# Patient Record
Sex: Female | Born: 1990 | Race: Black or African American | Hispanic: No | Marital: Single | Smoking: Never smoker
Health system: Southern US, Community
[De-identification: ages and names within clinical notes are randomized; demographics above are authoritative.]

## PROBLEM LIST (undated history)

## (undated) ENCOUNTER — Ambulatory Visit

## (undated) DIAGNOSIS — R0600 Dyspnea, unspecified: Secondary | ICD-10-CM

## (undated) DIAGNOSIS — I10 Essential (primary) hypertension: Secondary | ICD-10-CM

## (undated) DIAGNOSIS — R51 Headache: Secondary | ICD-10-CM

## (undated) DIAGNOSIS — R519 Headache, unspecified: Secondary | ICD-10-CM

## (undated) DIAGNOSIS — M940 Chondrocostal junction syndrome [Tietze]: Secondary | ICD-10-CM

## (undated) DIAGNOSIS — E282 Polycystic ovarian syndrome: Secondary | ICD-10-CM

## (undated) HISTORY — DX: Polycystic ovarian syndrome: E28.2

## (undated) HISTORY — PX: UTERINE SEPTUM RESECTION: SHX5386

## (undated) HISTORY — PX: WISDOM TOOTH EXTRACTION: SHX21

## (undated) HISTORY — PX: SHOULDER SURGERY: SHX246

---
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
## 2012-04-01 DIAGNOSIS — Z0000 Encounter for general adult medical examination without abnormal findings: Secondary | ICD-10-CM | POA: Insufficient documentation

## 2013-02-28 ENCOUNTER — Ambulatory Visit: Payer: Self-pay

## 2013-05-02 ENCOUNTER — Emergency Department (HOSPITAL_COMMUNITY): Payer: 59

## 2013-05-02 ENCOUNTER — Encounter (HOSPITAL_COMMUNITY): Payer: Self-pay | Admitting: Emergency Medicine

## 2013-05-02 ENCOUNTER — Emergency Department (HOSPITAL_COMMUNITY)
Admission: EM | Admit: 2013-05-02 | Discharge: 2013-05-03 | Disposition: A | Discharge status: Home / Self Care | Payer: 59 | Attending: Emergency Medicine | Admitting: Emergency Medicine

## 2013-05-02 DIAGNOSIS — Y92009 Unspecified place in unspecified non-institutional (private) residence as the place of occurrence of the external cause: Secondary | ICD-10-CM | POA: Insufficient documentation

## 2013-05-02 DIAGNOSIS — W010XXA Fall on same level from slipping, tripping and stumbling without subsequent striking against object, initial encounter: Secondary | ICD-10-CM | POA: Insufficient documentation

## 2013-05-02 DIAGNOSIS — Y9389 Activity, other specified: Secondary | ICD-10-CM | POA: Insufficient documentation

## 2013-05-02 DIAGNOSIS — S42023A Displaced fracture of shaft of unspecified clavicle, initial encounter for closed fracture: Secondary | ICD-10-CM | POA: Insufficient documentation

## 2013-05-02 DIAGNOSIS — Z791 Long term (current) use of non-steroidal anti-inflammatories (NSAID): Secondary | ICD-10-CM | POA: Insufficient documentation

## 2013-05-02 DIAGNOSIS — Z79899 Other long term (current) drug therapy: Secondary | ICD-10-CM | POA: Insufficient documentation

## 2013-05-02 DIAGNOSIS — S42002A Fracture of unspecified part of left clavicle, initial encounter for closed fracture: Secondary | ICD-10-CM

## 2013-05-02 MED ORDER — OXYCODONE-ACETAMINOPHEN 5-325 MG PO TABS
2.0000 | ORAL_TABLET | Freq: Once | ORAL | Status: AC
  Administered 2013-05-02: 2 via ORAL
  Filled 2013-05-02: qty 2

## 2013-05-02 NOTE — ED Notes (Signed)
Bed: [facility] Expected date:  Expected time:  Means of arrival:  Comments: EMS/deformity to clavicle

## 2013-05-02 NOTE — ED Notes (Signed)
Brought in by EMS from home after pt's fall with subsequent pain and deformity to left clavicle.  Per EMS, pt tripped over and her boyfriend tried to catch her from falling but both fell and pt landed on her left side on a carpeted floor--- pt has had immediate pain to left clavicle area.  Pt arrived to ED on triangle bandage.

## 2013-05-03 MED ORDER — OXYCODONE-ACETAMINOPHEN 5-325 MG PO TABS: 2.0000 | ORAL_TABLET | ORAL | Status: DC | PRN

## 2013-05-03 NOTE — ED Provider Notes (Signed)
CSN: [id number]     Arrival date & time 05/02/13  2224 History   First MD Initiated Contact with Patient 05/02/13 2319     Chief Complaint  Patient presents with  . Clavicle Injury   (Consider location/radiation/quality/duration/timing/severity/associated sxs/prior Treatment) Patient is a 22 y.o. female presenting with fall. The history is provided by the patient. No language interpreter was used.  Fall This is a new problem. The current episode started today. Pertinent negatives include no chest pain.   Pt is a 22 year old who was being carried in her boyfriends arms tonight when he tripped and fell with her. She presents with left clavicle pain. She denies any numbness, tingling or paresthesias. She reports limited ROM due to pain. She has not taken anything for the pain or used an ice pack.   History reviewed. No pertinent past medical history. History reviewed. No pertinent past surgical history. History reviewed. No pertinent family history. History  Substance Use Topics  . Smoking status: Never Smoker   . Smokeless tobacco: Never Used  . Alcohol Use: Yes   OB History   Grav Para Term Preterm Abortions TAB SAB Ect Mult Living                 Review of Systems  Respiratory: Negative for chest tightness and shortness of breath.   Cardiovascular: Negative for chest pain.  All other systems reviewed and are negative.    Allergies  Peanuts and Shrimp  Home Medications   Current Outpatient Rx  Name  Route  Sig  Dispense  Refill  . ibuprofen (ADVIL,MOTRIN) 200 MG tablet   Oral   Take 200 mg by mouth every 6 (six) hours as needed.         . medroxyPROGESTERone (DEPO-PROVERA) 150 MG/ML injection   Intramuscular   Inject 150 mg into the muscle every 3 (three) months. September 2014         . naproxen sodium (ANAPROX) 220 MG tablet   Oral   Take 220 mg by mouth 2 (two) times daily with a meal.         . oxyCODONE-acetaminophen (PERCOCET/ROXICET) 5-325 MG per  tablet   Oral   Take 2 tablets by mouth every 4 (four) hours as needed for severe pain.   15 tablet   0    BP 116/112  Pulse 110  Temp(Src) 99.4 F (37.4 C) (Oral)  Resp 20  SpO2 99% Physical Exam  Nursing note and vitals reviewed. Constitutional: She is oriented to person, place, and time. She appears well-developed and well-nourished. No distress.  HENT:  Head: Normocephalic and atraumatic.  Mouth/Throat: Oropharynx is clear and moist.  Eyes: Conjunctivae and EOM are normal. Pupils are equal, round, and reactive to light.  Neck: Normal range of motion. Neck supple. No JVD present. No tracheal deviation present. No thyromegaly present.  Cardiovascular: Normal rate, regular rhythm and normal heart sounds.   Pulmonary/Chest: Effort normal and breath sounds normal.  Abdominal: Soft. Bowel sounds are normal. She exhibits no distension. There is no tenderness.  Musculoskeletal:       Left shoulder: She exhibits normal range of motion.       Arms: Edema and noted deformity left clavicle.  Neurological: She is alert and oriented to person, place, and time.  Skin: Skin is warm and dry.  Psychiatric: She has a normal mood and affect. Her behavior is normal. Judgment and thought content normal.    ED Course  Procedures (including critical  care time) Labs Review Labs Reviewed - No data to display Imaging Review Dg Chest 2 View  05/03/2013   CLINICAL DATA:  Shortness of breath; known left clavicular fracture.  EXAM: CHEST  2 VIEW  COMPARISON:  None.  FINDINGS: The lungs are well-aerated and clear. There is no evidence of focal opacification, pleural effusion or pneumothorax.  The heart is normal in size; the mediastinal contour is within normal limits. A mildly displaced and shortened left mid-clavicular fracture is again noted.  IMPRESSION: 1. No acute cardiopulmonary process seen. 2. Mildly displaced and shortened left mid-clavicular fracture again noted.   Electronically Signed   By:  Roanna Raider M.D.   On: 05/03/2013 00:17   Dg Shoulder Left  05/02/2013   CLINICAL DATA:  Left clavicle pain following a fall.  EXAM: LEFT SHOULDER - 2+ VIEW  COMPARISON:  None.  FINDINGS: Fracture of the mid to distal left clavicle with 2 cm of overlapping of the fragments as well as inferior displacement and superior angulation of the distal fragment.  IMPRESSION: Left clavicle fracture as described above.   Electronically Signed   By: Gordan Payment M.D.   On: 05/02/2013 23:28    EKG Interpretation   None       MDM   1. Clavicle fracture, left, closed, initial encounter    Pt's boyfriend fell while he was carrying her. +deformity, left clavicle. X-ray; fracture of distal left clavicle with 2cm of overlapping, inferior displacement. Ortho consult via phone, Dr. Yisroel Ramming, place pt in sling and follow-up this week in office with him. Percocet for mod-severe pain.      Irish Elders, NP 05/03/13 [phone]

## 2013-05-03 NOTE — ED Provider Notes (Signed)
Medical screening examination/treatment/procedure(s) were performed by non-physician practitioner and as supervising physician I was immediately available for consultation/collaboration.  EKG Interpretation   None         Shanna Cisco, MD 05/03/13 1054

## 2014-11-21 ENCOUNTER — Other Ambulatory Visit: Payer: Self-pay | Admitting: Family Medicine

## 2014-11-21 ENCOUNTER — Ambulatory Visit
Admission: RE | Admit: 2014-11-21 | Discharge: 2014-11-21 | Disposition: A | Discharge status: Home / Self Care | Payer: 59 | Source: Ambulatory Visit | Attending: Family Medicine | Admitting: Family Medicine

## 2014-11-21 DIAGNOSIS — R079 Chest pain, unspecified: Secondary | ICD-10-CM

## 2016-03-10 ENCOUNTER — Other Ambulatory Visit: Payer: Self-pay

## 2016-03-12 ENCOUNTER — Other Ambulatory Visit: Payer: Self-pay

## 2016-03-13 ENCOUNTER — Other Ambulatory Visit: Payer: Self-pay

## 2016-08-13 ENCOUNTER — Other Ambulatory Visit: Payer: Self-pay | Admitting: Obstetrics and Gynecology

## 2016-08-13 DIAGNOSIS — N63 Unspecified lump in unspecified breast: Secondary | ICD-10-CM

## 2016-08-17 ENCOUNTER — Ambulatory Visit
Admission: RE | Admit: 2016-08-17 | Discharge: 2016-08-17 | Disposition: A | Discharge status: Home / Self Care | Payer: 59 | Source: Ambulatory Visit | Attending: Obstetrics and Gynecology | Admitting: Obstetrics and Gynecology

## 2016-08-17 DIAGNOSIS — N63 Unspecified lump in unspecified breast: Secondary | ICD-10-CM

## 2016-12-17 ENCOUNTER — Encounter: Payer: Self-pay | Admitting: Obstetrics and Gynecology

## 2016-12-17 NOTE — H&P (Addendum)
Kaitlyn Noble is an 26 y.o. female. Presenting for MAB. 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   Pertinent Gynecological History: Menses: flow is moderate Bleeding: dysfunctional uterine bleeding Contraception: none DES exposure: denies   H/o abnl pap smear  Past Medical History:  Diagnosis Date  . Chronic hypertension   Labetalol 200mg  BID  No past surgical history on file.  No family history on file.  Social History:  reports that she has never smoked. She has never used smokeless tobacco. She reports that she drinks alcohol. She reports that she does not use drugs.  Allergies:  Allergies  Allergen Reactions  . Peanuts [Peanut Oil] Hives  . Shrimp [Shellfish Allergy]     unknown    No prescriptions prior to admission.    ROS  There were no vitals taken for this visit. Physical Exam  From office Gen: well appearing, NAD CV: Reg rate Pulm: NWOB Abd: soft, nondistended, nontender, no masses GYN: uterus 7-10 week size, no adnexa ttp/CMT Ext: No edema b/l   No results found for this or any previous visit (from the past 24 hour(s)).  No results found.  Assessment/Plan: 25yo w/MAB @~8wga. 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  cHTN - c/w labetaol 200mg  BID Risks discussed including infection, bleeding, damage to surrounding structures, need for additional procedures, postoperative DVT, blood transfusion, and hysterectomy. All questions answered. Consent signed in clinic. [facility] given possible uterine septum. SIS postpartum per primary GYN attending.  Plan IV doxycyline preop and postop for MAB RH +.   Kaitlyn Noble 12/17/2016, 2:21 PM   No updates to above H&P. Patient arrived NPO and was consented in PACU. Risks discussed including infection, bleeding, damage to surrounding structures, need for additional procedures, and postoperative DVT/transfusion. All questions answered. Consent signed.  Proceed with above surgery.   Belva AgeeElise Babbette Dalesandro MD

## 2016-12-18 ENCOUNTER — Encounter (HOSPITAL_COMMUNITY): Payer: Self-pay

## 2016-12-22 ENCOUNTER — Ambulatory Visit (HOSPITAL_COMMUNITY): Payer: 59 | Admitting: Registered Nurse

## 2016-12-22 ENCOUNTER — Encounter (HOSPITAL_COMMUNITY): Payer: Self-pay | Admitting: Registered Nurse

## 2016-12-22 ENCOUNTER — Ambulatory Visit (HOSPITAL_COMMUNITY)
Admission: RE | Admit: 2016-12-22 | Discharge: 2016-12-22 | Disposition: A | Discharge status: Home / Self Care | Payer: 59 | Source: Ambulatory Visit | Attending: Obstetrics and Gynecology | Admitting: Obstetrics and Gynecology

## 2016-12-22 ENCOUNTER — Ambulatory Visit (HOSPITAL_COMMUNITY): Payer: 59

## 2016-12-22 ENCOUNTER — Encounter (HOSPITAL_COMMUNITY)
Admission: RE | Disposition: A | Discharge status: Home / Self Care | Payer: Self-pay | Source: Ambulatory Visit | Attending: Obstetrics and Gynecology

## 2016-12-22 DIAGNOSIS — Z91013 Allergy to seafood: Secondary | ICD-10-CM | POA: Diagnosis not present

## 2016-12-22 DIAGNOSIS — Z3A10 10 weeks gestation of pregnancy: Secondary | ICD-10-CM | POA: Diagnosis not present

## 2016-12-22 DIAGNOSIS — O021 Missed abortion: Secondary | ICD-10-CM | POA: Diagnosis not present

## 2016-12-22 DIAGNOSIS — N938 Other specified abnormal uterine and vaginal bleeding: Secondary | ICD-10-CM | POA: Insufficient documentation

## 2016-12-22 DIAGNOSIS — Z91010 Allergy to peanuts: Secondary | ICD-10-CM | POA: Diagnosis not present

## 2016-12-22 DIAGNOSIS — O161 Unspecified maternal hypertension, first trimester: Secondary | ICD-10-CM | POA: Diagnosis not present

## 2016-12-22 HISTORY — PX: DILATION AND EVACUATION: SHX1459

## 2016-12-22 HISTORY — DX: Headache: R51

## 2016-12-22 HISTORY — DX: Headache, unspecified: R51.9

## 2016-12-22 HISTORY — DX: Dyspnea, unspecified: R06.00

## 2016-12-22 HISTORY — DX: Essential (primary) hypertension: I10

## 2016-12-22 HISTORY — DX: Chondrocostal junction syndrome (tietze): M94.0

## 2016-12-22 SURGERY — DILATION AND EVACUATION, UTERUS
Anesthesia: General

## 2016-12-22 MED ORDER — ONDANSETRON HCL 4 MG/2ML IJ SOLN
INTRAMUSCULAR | Status: DC | PRN
Start: 2016-12-22 — End: 2016-12-22
  Administered 2016-12-22: 4 mg via INTRAVENOUS

## 2016-12-22 MED ORDER — KETOROLAC TROMETHAMINE 30 MG/ML IJ SOLN
INTRAMUSCULAR | Status: AC
  Filled 2016-12-22: qty 1

## 2016-12-22 MED ORDER — LIDOCAINE 2% (20 MG/ML) 5 ML SYRINGE
INTRAMUSCULAR | Status: DC | PRN
  Administered 2016-12-22: 60 mg via INTRAVENOUS

## 2016-12-22 MED ORDER — ONDANSETRON HCL 4 MG/2ML IJ SOLN
INTRAMUSCULAR | Status: AC
  Filled 2016-12-22: qty 2

## 2016-12-22 MED ORDER — IBUPROFEN 600 MG PO TABS: 600.0000 mg | ORAL_TABLET | Freq: Four times a day (QID) | ORAL | Status: DC | PRN

## 2016-12-22 MED ORDER — ONDANSETRON HCL 4 MG/2ML IJ SOLN
4.0000 mg | Freq: Once | INTRAMUSCULAR | Status: AC | PRN
  Administered 2016-12-22: 4 mg via INTRAVENOUS

## 2016-12-22 MED ORDER — FENTANYL CITRATE (PF) 100 MCG/2ML IJ SOLN
25.0000 ug | INTRAMUSCULAR | Status: DC | PRN
  Administered 2016-12-22 (×2): 50 ug via INTRAVENOUS

## 2016-12-22 MED ORDER — MIDAZOLAM HCL 5 MG/5ML IJ SOLN
INTRAMUSCULAR | Status: DC | PRN
  Administered 2016-12-22: 2 mg via INTRAVENOUS

## 2016-12-22 MED ORDER — DEXAMETHASONE SODIUM PHOSPHATE 10 MG/ML IJ SOLN
INTRAMUSCULAR | Status: AC
  Filled 2016-12-22: qty 1

## 2016-12-22 MED ORDER — SCOPOLAMINE 1 MG/3DAYS TD PT72
1.0000 | MEDICATED_PATCH | Freq: Once | TRANSDERMAL | Status: DC
Start: 2016-12-22 — End: 2016-12-22
  Administered 2016-12-22: 1.5 mg via TRANSDERMAL

## 2016-12-22 MED ORDER — DOXYCYCLINE HYCLATE 100 MG IV SOLR
200.0000 mg | Freq: Once | INTRAVENOUS | Status: AC
  Administered 2016-12-22: 200 mg via INTRAVENOUS
  Filled 2016-12-22: qty 200

## 2016-12-22 MED ORDER — LIDOCAINE HCL (CARDIAC) 20 MG/ML IV SOLN
INTRAVENOUS | Status: AC
  Filled 2016-12-22: qty 5

## 2016-12-22 MED ORDER — FENTANYL CITRATE (PF) 100 MCG/2ML IJ SOLN
INTRAMUSCULAR | Status: DC | PRN
  Administered 2016-12-22: 25 ug via INTRAVENOUS
  Administered 2016-12-22: 50 ug via INTRAVENOUS
  Administered 2016-12-22: 25 ug via INTRAVENOUS

## 2016-12-22 MED ORDER — LACTATED RINGERS IV SOLN
INTRAVENOUS | Status: DC
  Administered 2016-12-22: 13:00:00 via INTRAVENOUS

## 2016-12-22 MED ORDER — DEXAMETHASONE SODIUM PHOSPHATE 10 MG/ML IJ SOLN
INTRAMUSCULAR | Status: DC | PRN
  Administered 2016-12-22: 10 mg via INTRAVENOUS

## 2016-12-22 MED ORDER — KETOROLAC TROMETHAMINE 30 MG/ML IJ SOLN
INTRAMUSCULAR | Status: DC | PRN
  Administered 2016-12-22: 30 mg via INTRAVENOUS

## 2016-12-22 MED ORDER — PROPOFOL 10 MG/ML IV BOLUS
INTRAVENOUS | Status: DC | PRN
  Administered 2016-12-22: 40 mg via INTRAVENOUS
  Administered 2016-12-22: 160 mg via INTRAVENOUS

## 2016-12-22 MED ORDER — SCOPOLAMINE 1 MG/3DAYS TD PT72
MEDICATED_PATCH | TRANSDERMAL | Status: AC
  Administered 2016-12-22: 1.5 mg via TRANSDERMAL
  Filled 2016-12-22: qty 1

## 2016-12-22 MED ORDER — MIDAZOLAM HCL 2 MG/2ML IJ SOLN
INTRAMUSCULAR | Status: AC
Start: 2016-12-22 — End: ?
  Filled 2016-12-22: qty 2

## 2016-12-22 MED ORDER — CHLOROPROCAINE HCL 1 % IJ SOLN
INTRAMUSCULAR | Status: DC | PRN
  Administered 2016-12-22: 10 mL

## 2016-12-22 MED ORDER — OXYCODONE-ACETAMINOPHEN 5-325 MG PO TABS: 1.0000 | ORAL_TABLET | ORAL | Status: DC | PRN

## 2016-12-22 MED ORDER — ONDANSETRON HCL 4 MG PO TABS: 4.0000 mg | ORAL_TABLET | Freq: Four times a day (QID) | ORAL | Status: DC | PRN

## 2016-12-22 MED ORDER — ONDANSETRON HCL 4 MG/2ML IJ SOLN
4.0000 mg | Freq: Four times a day (QID) | INTRAMUSCULAR | Status: DC | PRN
Start: 2016-12-22 — End: 2016-12-22

## 2016-12-22 MED ORDER — PROPOFOL 10 MG/ML IV BOLUS
INTRAVENOUS | Status: AC
  Filled 2016-12-22: qty 20

## 2016-12-22 MED ORDER — FENTANYL CITRATE (PF) 100 MCG/2ML IJ SOLN
INTRAMUSCULAR | Status: AC
  Administered 2016-12-22: 50 ug via INTRAVENOUS
  Filled 2016-12-22: qty 2

## 2016-12-22 MED ORDER — FENTANYL CITRATE (PF) 100 MCG/2ML IJ SOLN
INTRAMUSCULAR | Status: AC
  Filled 2016-12-22: qty 2

## 2016-12-22 SURGICAL SUPPLY — 19 items
CATH ROBINSON RED A/P 16FR (CATHETERS) ×3 IMPLANT
CLOTH BEACON ORANGE TIMEOUT ST (SAFETY) ×3 IMPLANT
DECANTER SPIKE VIAL GLASS SM (MISCELLANEOUS) ×3 IMPLANT
GLOVE BIO SURGEON STRL SZ 6.5 (GLOVE) ×2 IMPLANT
GLOVE BIO SURGEONS STRL SZ 6.5 (GLOVE) ×1
GLOVE BIOGEL PI IND STRL 7.0 (GLOVE) ×2 IMPLANT
GLOVE BIOGEL PI INDICATOR 7.0 (GLOVE) ×4
GOWN STRL REUS W/TWL LRG LVL3 (GOWN DISPOSABLE) ×6 IMPLANT
KIT BERKELEY 1ST TRIMESTER 3/8 (MISCELLANEOUS) ×3 IMPLANT
NS IRRIG 1000ML POUR BTL (IV SOLUTION) ×3 IMPLANT
PACK VAGINAL MINOR WOMEN LF (CUSTOM PROCEDURE TRAY) ×3 IMPLANT
PAD OB MATERNITY 4.3X12.25 (PERSONAL CARE ITEMS) ×3 IMPLANT
PAD PREP 24X48 CUFFED NSTRL (MISCELLANEOUS) ×3 IMPLANT
SET BERKELEY SUCTION TUBING (SUCTIONS) ×3 IMPLANT
TOWEL OR 17X24 6PK STRL BLUE (TOWEL DISPOSABLE) ×6 IMPLANT
VACURETTE 10 RIGID CVD (CANNULA) IMPLANT
VACURETTE 7MM CVD STRL WRAP (CANNULA) IMPLANT
VACURETTE 8 RIGID CVD (CANNULA) ×2 IMPLANT
VACURETTE 9 RIGID CVD (CANNULA) ×2 IMPLANT

## 2016-12-22 NOTE — Op Note (Signed)
PREOPERATIVE DIAGNOSES: 1. Missed Abortion  POSTOPERATIVE DIAGNOSES: Same  PROCEDURE PERFORMED: Dilation, suction, sharp curretage  SURGEON: Dr. Belva AgeeElise Leger  ANESTHESIA: Paracervical block and IV sedation  ESTIMATED BLOOD LOSS: 10cc.  COMPLICATIONS: None  TUBES: None.  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 After bimanual examination, the cervix was exposed with a speculum and the anterior lip of the cervix grasped with a tenaculum. Paracervical block performed. The endocervical canal was then progressively dilated to 8mm. Suction catheter was introduced into the uterus and to the uterine fundus. The uterus was evacuated under US guidance  and good tissue return was noted. A sarp curettage was then performed until gritty texture noted. US notable for empty uterine cavity, thin EMS - no evidence of rPOC. 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 All instruments were removed from vagina. The sponge and lap counts were correct times 2 at this time. The patient's procedure was terminated. We then awakened her. She was sent to thhe Recovery Room in good condition.    Belva AgeeElise Leger MD

## 2016-12-22 NOTE — Discharge Instructions (Signed)
DISCHARGE INSTRUCTIONS: D&E °The following instructions have been prepared to help you care for yourself upon your return home. °  °Personal hygiene: °• Use sanitary pads for vaginal drainage, not tampons. °• Shower the day after your procedure. °• NO tub baths, pools or Jacuzzis for 2-3 weeks. °• Wipe front to back after using the bathroom. ° °Activity and limitations: °• Do NOT drive or operate any equipment for 24 hours. The effects of anesthesia are still present and drowsiness may result. °• Do NOT rest in bed all day. °• Walking is encouraged. °• Walk up and down stairs slowly. °• You may resume your normal activity in one to two days or as indicated by your physician. ° °Sexual activity: NO intercourse for at least 2 weeks after the procedure, or as indicated by your physician. ° °Diet: Eat a light meal as desired this evening. You may resume your usual diet tomorrow. ° °Return to work: You may resume your work activities in one to two days or as indicated by your doctor. ° °What to expect after your surgery: Expect to have vaginal bleeding/discharge for 2-3 days and spotting for up to 10 days. It is not unusual to have soreness for up to 1-2 weeks. You may have a slight burning sensation when you urinate for the first day. Mild cramps may continue for a couple of days. You may have a regular period in 2-6 weeks. ° °Call your doctor for any of the following: °• Excessive vaginal bleeding, saturating and changing one pad every hour. °• Inability to urinate 6 hours after discharge from hospital. °• Pain not relieved by pain medication. °• Fever of 100.4° F or greater. °• Unusual vaginal discharge or odor. ° ° °Post Anesthesia Home Care Instructions ° °Activity: °Get plenty of rest for the remainder of the day. A responsible individual must stay with you for 24 hours following the procedure.  °For the next 24 hours, DO NOT: °-Drive a car °-Operate machinery °-Drink alcoholic beverages °-Take any medication  unless instructed by your physician °-Make any legal decisions or sign important papers. ° °Meals: °Start with liquid foods such as gelatin or soup. Progress to regular foods as tolerated. Avoid greasy, spicy, heavy foods. If nausea and/or vomiting occur, drink only clear liquids until the nausea and/or vomiting subsides. Call your physician if vomiting continues. ° °Special Instructions/Symptoms: °Your throat may feel dry or sore from the anesthesia or the breathing tube placed in your throat during surgery. If this causes discomfort, gargle with warm salt water. The discomfort should disappear within 24 hours. ° °If you had a scopolamine patch placed behind your ear for the management of post- operative nausea and/or vomiting: ° °1. The medication in the patch is effective for 72 hours, after which it should be removed.  Wrap patch in a tissue and discard in the trash. Wash hands thoroughly with soap and water. °2. You may remove the patch earlier than 72 hours if you experience unpleasant side effects which may include dry mouth, dizziness or visual disturbances. °3. Avoid touching the patch. Wash your hands with soap and water after contact with the patch. °   ° ° ° °

## 2016-12-22 NOTE — Anesthesia Procedure Notes (Signed)
Procedure Name: LMA Insertion Date/Time: 12/22/2016 12:58 PM Performed by: Jhonnie GarnerMARSHALL, Chaden Doom M Pre-anesthesia Checklist: Patient identified, Emergency Drugs available, Suction available and Patient being monitored Patient Re-evaluated:Patient Re-evaluated prior to inductionOxygen Delivery Method: Circle system utilized Preoxygenation: Pre-oxygenation with 100% oxygen Intubation Type: IV induction Ventilation: Mask ventilation without difficulty LMA: LMA inserted LMA Size: 4.0 Number of attempts: 1 Placement Confirmation: positive ETCO2 and breath sounds checked- equal and bilateral Tube secured with: Tape Dental Injury: Teeth and Oropharynx as per pre-operative assessment

## 2016-12-22 NOTE — Transfer of Care (Signed)
Immediate Anesthesia Transfer of Care Note  Patient: Kaitlyn Noble  Procedure(s) Performed: Procedure(s): DILATATION AND EVACUATION  with ultrasound guidance (N/A)  Patient Location: PACU  Anesthesia Type:General  Level of Consciousness:  sedated, patient cooperative and responds to stimulation  Airway & Oxygen Therapy:Patient Spontanous Breathing and Patient connected to face mask oxgen  Post-op Assessment:  Report given to PACU RN and Post -op Vital signs reviewed and stable  Post vital signs:  Reviewed and stable  Last Vitals:  Vitals:   12/22/16 1146  BP: 127/87  Pulse: 95  Temp: 36.7 C    Complications: No apparent anesthesia complications

## 2016-12-22 NOTE — Anesthesia Preprocedure Evaluation (Signed)
Anesthesia Evaluation  Patient identified by MRN, date of birth, ID band Patient awake    Reviewed: Allergy & Precautions, H&P , Patient's Chart, lab work & pertinent test results, reviewed documented beta blocker date and time   Airway Mallampati: II  TM Distance: >3 FB Neck ROM: full    Dental no notable dental hx.    Pulmonary    Pulmonary exam normal breath sounds clear to auscultation       Cardiovascular hypertension,  Rhythm:regular Rate:Normal     Neuro/Psych    GI/Hepatic   Endo/Other    Renal/GU      Musculoskeletal   Abdominal   Peds  Hematology   Anesthesia Other Findings   Reproductive/Obstetrics                             Anesthesia Physical Anesthesia Plan  ASA: II  Anesthesia Plan: General   Post-op Pain Management:    Induction: Intravenous  PONV Risk Score and Plan:   Airway Management Planned: LMA  Additional Equipment:   Intra-op Plan:   Post-operative Plan:   Informed Consent: I have reviewed the patients History and Physical, chart, labs and discussed the procedure including the risks, benefits and alternatives for the proposed anesthesia with the patient or authorized representative who has indicated his/her understanding and acceptance.   Dental Advisory Given  Plan Discussed with: CRNA and Surgeon  Anesthesia Plan Comments: ( )        Anesthesia Quick Evaluation  

## 2016-12-22 NOTE — Brief Op Note (Signed)
12/22/2016  1:35 PM  PATIENT:  Kaitlyn Noble  26 y.o. female  PRE-OPERATIVE DIAGNOSIS:  missed ab  POST-OPERATIVE DIAGNOSIS:  missed ab  PROCEDURE:  Procedure(s): DILATATION AND EVACUATION  with ultrasound guidance (N/A)  SURGEON:  Surgeon(s) and Role:    * Ranae PilaLeger, Hayven Croy Jennifer, MD - Primary  PHYSICIAN ASSISTANT:   ASSISTANTS: none   ANESTHESIA:   IV sedation and paracervical block  EBL:  Total I/O In: 500 [I.V.:500] Out: 40 [Urine:20; Blood:20]  BLOOD ADMINISTERED:none  DRAINS: none   LOCAL MEDICATIONS USED:  XYLOCAINE  and Amount: 10 ml  SPECIMEN:  Source of Specimen:  POC  DISPOSITION OF SPECIMEN:  PATHOLOGY  COUNTS:  YES  TOURNIQUET:  * No tourniquets in log *  DICTATION: .Note written in EPIC  PLAN OF CARE: Discharge to home after PACU  PATIENT DISPOSITION:  PACU - hemodynamically stable.   Delay start of Pharmacological VTE agent (>24hrs) due to surgical blood loss or risk of bleeding: not applicable

## 2016-12-24 ENCOUNTER — Encounter (HOSPITAL_COMMUNITY): Payer: Self-pay | Admitting: Obstetrics and Gynecology

## 2016-12-25 NOTE — Anesthesia Postprocedure Evaluation (Signed)
Anesthesia Post Note  Patient: Kaitlyn Noble  Procedure(s) Performed: Procedure(s) (LRB): DILATATION AND EVACUATION  with ultrasound guidance (N/A)     Patient location during evaluation: PACU Anesthesia Type: General Level of consciousness: awake and alert Pain management: pain level controlled Vital Signs Assessment: post-procedure vital signs reviewed and stable Respiratory status: spontaneous breathing, nonlabored ventilation, respiratory function stable and patient connected to nasal cannula oxygen Cardiovascular status: blood pressure returned to baseline and stable Postop Assessment: no signs of nausea or vomiting Anesthetic complications: no    Last Vitals:  Vitals:   12/22/16 1430 12/22/16 1525  BP: 136/90 140/80  Pulse: 77 76  Resp: 14 16  Temp:  36.9 C    Last Pain:  Vitals:   12/22/16 1415  TempSrc:   PainSc: 2                  Pantera Winterrowd EDWARD

## 2017-05-07 ENCOUNTER — Ambulatory Visit
Admission: RE | Admit: 2017-05-07 | Discharge: 2017-05-07 | Disposition: A | Discharge status: Home / Self Care | Payer: 59 | Source: Ambulatory Visit | Attending: Family Medicine | Admitting: Family Medicine

## 2017-05-07 ENCOUNTER — Other Ambulatory Visit: Payer: Self-pay | Admitting: Family Medicine

## 2017-05-07 DIAGNOSIS — R05 Cough: Secondary | ICD-10-CM

## 2017-05-07 DIAGNOSIS — R053 Chronic cough: Secondary | ICD-10-CM

## 2018-02-03 ENCOUNTER — Other Ambulatory Visit: Payer: Self-pay

## 2018-02-03 ENCOUNTER — Emergency Department (HOSPITAL_COMMUNITY): Payer: No Typology Code available for payment source

## 2018-02-03 ENCOUNTER — Encounter (HOSPITAL_COMMUNITY): Payer: Self-pay | Admitting: *Deleted

## 2018-02-03 ENCOUNTER — Emergency Department (HOSPITAL_COMMUNITY)
Admission: EM | Admit: 2018-02-03 | Discharge: 2018-02-03 | Disposition: A | Discharge status: Home / Self Care | Payer: No Typology Code available for payment source | Attending: Emergency Medicine | Admitting: Emergency Medicine

## 2018-02-03 DIAGNOSIS — M542 Cervicalgia: Secondary | ICD-10-CM | POA: Insufficient documentation

## 2018-02-03 DIAGNOSIS — M546 Pain in thoracic spine: Secondary | ICD-10-CM | POA: Diagnosis not present

## 2018-02-03 DIAGNOSIS — I10 Essential (primary) hypertension: Secondary | ICD-10-CM | POA: Diagnosis not present

## 2018-02-03 DIAGNOSIS — Z79899 Other long term (current) drug therapy: Secondary | ICD-10-CM | POA: Diagnosis not present

## 2018-02-03 MED ORDER — METHOCARBAMOL 500 MG PO TABS: 500.0000 mg | ORAL_TABLET | Freq: Two times a day (BID) | ORAL | 0 refills | Status: DC

## 2018-02-03 MED ORDER — IBUPROFEN 800 MG PO TABS: 800.0000 mg | ORAL_TABLET | Freq: Three times a day (TID) | ORAL | 0 refills | Status: DC

## 2018-02-03 MED ORDER — METHOCARBAMOL 500 MG PO TABS
500.0000 mg | ORAL_TABLET | Freq: Once | ORAL | Status: AC
  Administered 2018-02-03: 500 mg via ORAL
  Filled 2018-02-03: qty 1

## 2018-02-03 MED ORDER — IBUPROFEN 800 MG PO TABS
800.0000 mg | ORAL_TABLET | Freq: Once | ORAL | Status: AC
Start: 2018-02-03 — End: 2018-02-03
  Administered 2018-02-03: 800 mg via ORAL
  Filled 2018-02-03: qty 1
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
## 2018-02-03 NOTE — Discharge Instructions (Addendum)
1. Medications: robaxin, ibuprofen, usual home medications 2. Treatment: rest, drink plenty of fluids, gentle stretching as discussed, alternate ice and heat 3. Follow Up: Please followup with your primary doctor in 3-5 days for discussion of your diagnoses and further evaluation after today's visit; if you do not have a primary care doctor use the resource guide provided to find one;  Return to the ER for worsening back pain, difficulty walking, loss of bowel or bladder control, numbness, tingling weakness or other concerning symptoms

## 2018-06-22 DIAGNOSIS — U071 COVID-19: Secondary | ICD-10-CM

## 2018-06-22 HISTORY — DX: COVID-19: U07.1

## 2018-06-22 NOTE — L&D Delivery Note (Signed)
Delivery Note At 9:16 PM a viable female was delivered via Vaginal, Spontaneous    Presentation LOA Apgars pending Weight pending Placenta routine Cord PH not sent  Complications none  Anesthesia:   Episiotomy: None Lacerations: Periurethral Suture Repair: vicryl 4'0  Est. Blood Loss (mL):     It's a boy - "Brixley"!!   Mom to postpartum.  Baby to Couplet care / Skin to Skin.  Tyson Dense 05/11/2019, 9:48 PM

## 2018-10-17 LAB — OB RESULTS CONSOLE ABO/RH: RH Type: POSITIVE

## 2018-10-17 LAB — OB RESULTS CONSOLE GC/CHLAMYDIA
Chlamydia: NEGATIVE
Gonorrhea: NEGATIVE

## 2018-10-17 LAB — OB RESULTS CONSOLE ANTIBODY SCREEN: Antibody Screen: NEGATIVE

## 2018-10-17 LAB — OB RESULTS CONSOLE HIV ANTIBODY (ROUTINE TESTING): HIV: NONREACTIVE

## 2018-10-17 LAB — OB RESULTS CONSOLE RPR: RPR: NONREACTIVE

## 2018-10-17 LAB — OB RESULTS CONSOLE RUBELLA ANTIBODY, IGM: Rubella: IMMUNE

## 2018-10-17 LAB — OB RESULTS CONSOLE HEPATITIS B SURFACE ANTIGEN: Hepatitis B Surface Ag: NEGATIVE

## 2019-02-26 ENCOUNTER — Encounter (HOSPITAL_COMMUNITY): Payer: Self-pay

## 2019-02-26 ENCOUNTER — Other Ambulatory Visit: Payer: Self-pay

## 2019-02-26 ENCOUNTER — Inpatient Hospital Stay (HOSPITAL_COMMUNITY)
Admission: AD | Admit: 2019-02-26 | Discharge: 2019-02-26 | Disposition: A | Discharge status: Home / Self Care | Payer: Managed Care, Other (non HMO) | Attending: Obstetrics and Gynecology | Admitting: Obstetrics and Gynecology

## 2019-02-26 DIAGNOSIS — N949 Unspecified condition associated with female genital organs and menstrual cycle: Secondary | ICD-10-CM

## 2019-02-26 DIAGNOSIS — O2343 Unspecified infection of urinary tract in pregnancy, third trimester: Secondary | ICD-10-CM | POA: Insufficient documentation

## 2019-02-26 DIAGNOSIS — O26892 Other specified pregnancy related conditions, second trimester: Secondary | ICD-10-CM | POA: Diagnosis not present

## 2019-02-26 DIAGNOSIS — R102 Pelvic and perineal pain: Secondary | ICD-10-CM | POA: Insufficient documentation

## 2019-02-26 DIAGNOSIS — Z3A27 27 weeks gestation of pregnancy: Secondary | ICD-10-CM | POA: Diagnosis not present

## 2019-02-26 DIAGNOSIS — B3731 Acute candidiasis of vulva and vagina: Secondary | ICD-10-CM

## 2019-02-26 DIAGNOSIS — O98812 Other maternal infectious and parasitic diseases complicating pregnancy, second trimester: Secondary | ICD-10-CM | POA: Insufficient documentation

## 2019-02-26 DIAGNOSIS — B373 Candidiasis of vulva and vagina: Secondary | ICD-10-CM

## 2019-02-26 DIAGNOSIS — O2342 Unspecified infection of urinary tract in pregnancy, second trimester: Secondary | ICD-10-CM

## 2019-02-26 DIAGNOSIS — O10012 Pre-existing essential hypertension complicating pregnancy, second trimester: Secondary | ICD-10-CM | POA: Diagnosis not present

## 2019-02-26 HISTORY — DX: Unspecified condition associated with female genital organs and menstrual cycle: N94.9

## 2019-02-26 HISTORY — DX: Acute candidiasis of vulva and vagina: B37.31

## 2019-02-26 LAB — URINALYSIS, ROUTINE W REFLEX MICROSCOPIC
Bilirubin Urine: NEGATIVE
Glucose, UA: NEGATIVE mg/dL
Hgb urine dipstick: NEGATIVE
Ketones, ur: 5 mg/dL — AB
Leukocytes,Ua: NEGATIVE
Nitrite: NEGATIVE
Protein, ur: 30 mg/dL — AB
Specific Gravity, Urine: 1.021 (ref 1.005–1.030)
pH: 7 (ref 5.0–8.0)

## 2019-02-26 LAB — WET PREP, GENITAL
Clue Cells Wet Prep HPF POC: NONE SEEN
Sperm: NONE SEEN
Trich, Wet Prep: NONE SEEN

## 2019-02-26 MED ORDER — TERCONAZOLE 0.4 % VA CREA: 1.0000 | TOPICAL_CREAM | Freq: Every day | VAGINAL | 0 refills | Status: AC

## 2019-02-26 MED ORDER — CEFADROXIL 500 MG PO CAPS: 500.0000 mg | ORAL_CAPSULE | Freq: Two times a day (BID) | ORAL | 0 refills | Status: AC

## 2019-02-26 MED ORDER — COMFORT FIT MATERNITY SUPP SM MISC: 1.0000 [IU] | Freq: Every day | 0 refills | Status: DC | PRN
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
## 2019-02-26 NOTE — Discharge Instructions (Signed)
Pregnancy and Urinary Tract Infection  A urinary tract infection (UTI) is an infection of any part of the urinary tract. This includes the kidneys, the tubes that connect your kidneys to your bladder (ureters), the bladder, and the tube that carries urine out of your body (urethra). These organs make, store, and get rid of urine in the body. Your health care provider may use other names to describe the infection. An upper UTI affects the ureters and kidneys (pyelonephritis). A lower UTI affects the bladder (cystitis) and urethra (urethritis). Most urinary tract infections are caused by bacteria in your genital area, around the entrance to your urinary tract (urethra). These bacteria grow and cause irritation and inflammation of your urinary tract. You are more likely to develop a UTI during pregnancy because the physical and hormonal changes your body goes through can make it easier for bacteria to get into your urinary tract. Your growing baby also puts pressure on your bladder and can affect urine flow. It is important to recognize and treat UTIs in pregnancy because of the risk of serious complications for both you and your baby. How does this affect me? Symptoms of a UTI include:  Needing to urinate right away (urgently).  Frequent urination or passing small amounts of urine frequently.  Pain or burning with urination.  Blood in the urine.  Urine that smells bad or unusual.  Trouble urinating.  Cloudy urine.  Pain in the abdomen or lower back.  Vaginal discharge. You may also have:  Vomiting or a decreased appetite.  Confusion.  Irritability or tiredness.  A fever.  Diarrhea. How does this affect my baby? An untreated UTI during pregnancy could lead to a kidney infection or a systemic infection, which can cause health problems that could affect your baby. Possible complications of an untreated UTI include:  Giving birth to your baby before 37 weeks of pregnancy  (premature).  Having a baby with a low birth weight.  Developing high blood pressure during pregnancy (preeclampsia).  Having a low hemoglobin level (anemia). What can I do to lower my risk? To prevent a UTI:  Go to the bathroom as soon as you feel the need. Do not hold urine for long periods of time.  Always wipe from front to back, especially after a bowel movement. Use each tissue one time when you wipe.  Empty your bladder after sex.  Keep your genital area dry.  Drink 6-10 glasses of water each day.  Do not douche or use deodorant sprays. How is this treated? Treatment for this condition may include:  Antibiotic medicines that are safe to take during pregnancy.  Other medicines to treat less common causes of UTI. Follow these instructions at home:  If you were prescribed an antibiotic medicine, take it as told by your health care provider. Do not stop using the antibiotic even if you start to feel better.  Keep all follow-up visits as told by your health care provider. This is important. Contact a health care provider if:  Your symptoms do not improve or they get worse.  You have abnormal vaginal discharge. Get help right away if you:  Have a fever.  Have nausea and vomiting.  Have back or side pain.  Feel contractions in your uterus.  Have lower belly pain.  Have a gush of fluid from your vagina.  Have blood in your urine. Summary  A urinary tract infection (UTI) is an infection of any part of the urinary tract, which includes  the kidneys, ureters, bladder, and urethra.  Most urinary tract infections are caused by bacteria in your genital area, around the entrance to your urinary tract (urethra).  You are more likely to develop a UTI during pregnancy.  If you were prescribed an antibiotic medicine, take it as told by your health care provider. Do not stop using the antibiotic even if you start to feel better. This information is not intended to  replace advice given to you by your health care provider. Make sure you discuss any questions you have with your health care provider. Document Released: 10/03/2010 Document Revised: 09/30/2018 Document Reviewed: 05/12/2018 Elsevier Patient Education  2020 [employer]vier Inc. 1) Please do the round ligament exercises you were shown today at least twice a day.  2) You can purchase a maternity support belt on Amazon or locally at: [employer]  [address]  [place], [place] [address]  [phone]  3) Your urine is suspicious for infection, so there has been a cuture sent on it. The results will not be back until Tuesday or Wednesday of this week. I have sent a prescription for urinary tract infection. Please take it as prescribed.  4) GO WHITE: Soap: UNSCENTED Dove (white box light green writing) Laundry detergent (underwear)- Dreft or [employer] unscented WHITE 100% cotton panties (NOT just cotton crouch) Sanitary napkin/panty liners: UNSCENTED.  If it doesn't SAY unscented it can have a scent/perfume    NO PERFUMES OR LOTIONS OR POTIONS in the vulvar area (may use regular [place]) Condoms: hypoallergenic only. Non dyed (no color) Toilet papers: white only Wash clothes: use a separate wash cloth. WHITE.  Wash in [place].

## 2019-02-26 NOTE — MAU Note (Signed)
Kaitlyn Noble is a 28 y.o. at [redacted]w[redacted]d here in MAU reporting: states she is having a lot of pain in her pelvis. States it is worse when she is up moving or switching positions. Unsure if it is round ligament pain. States she has had these pains but last night it got really bad. No bleeding or LOF. +FM  Onset of complaint: ongoing  Pain score: 8/10  Vitals:   02/26/19 1103  BP: 119/67  Pulse: 97  Resp: 16  Temp: 98.6 F (37 C)  SpO2: 99%     FHT: +FM  Lab orders placed from triage: UA

## 2019-02-27 LAB — CULTURE, OB URINE
Culture: NO GROWTH
Special Requests: NORMAL

## 2019-03-01 LAB — GC/CHLAMYDIA PROBE AMP (~~LOC~~) NOT AT ARMC
Chlamydia: NEGATIVE
Neisseria Gonorrhea: NEGATIVE

## 2019-03-14 ENCOUNTER — Ambulatory Visit
Admission: EM | Admit: 2019-03-14 | Discharge: 2019-03-14 | Disposition: A | Discharge status: Home / Self Care | Payer: Managed Care, Other (non HMO) | Attending: Emergency Medicine | Admitting: Emergency Medicine

## 2019-03-14 ENCOUNTER — Other Ambulatory Visit: Payer: Self-pay

## 2019-03-14 DIAGNOSIS — H9203 Otalgia, bilateral: Secondary | ICD-10-CM | POA: Diagnosis not present

## 2019-03-14 MED ORDER — FLUTICASONE PROPIONATE 50 MCG/ACT NA SUSP: 1.0000 | Freq: Every day | NASAL | 0 refills | Status: DC

## 2019-03-14 NOTE — ED Provider Notes (Signed)
[name]    CSN: [id number] Arrival date & time: 03/14/19  1103      History   Chief Complaint Chief Complaint  Patient presents with  . Otalgia    HPI Kaitlyn Noble is a 28 y.o. female with history of allergies, [redacted] weeks gestation presenting for intermittent, bilateral ear pain that is worse at night.  Patient states it will come and go, has not been constant.  Has noticed slight, thin discharge when laying on her side.  Denies blood, trauma, popping sensation.  No decreased hearing, tinnitus, dizziness, facial pain, dental issues.  Patient has been taking her allergy medications with moderate relief.   Past Medical History:  Diagnosis Date  . Chronic hypertension   . Costochondritis    history  . Dyspnea    Occasional-history  . Headache    Migraines    Patient Active Problem List   Diagnosis Date Noted  . Candida vaginitis 02/26/2019  . Round ligament pain 02/26/2019    Past Surgical History:  Procedure Laterality Date  . DILATION AND EVACUATION N/A 12/22/2016   Procedure: DILATATION AND EVACUATION  with ultrasound guidance;  Surgeon: Ranae Pila, MD;  Location: [facility];  Service: Gynecology;  Laterality: N/A;  . SHOULDER SURGERY Left   . WISDOM TOOTH EXTRACTION      OB History    Gravida  2   Para      Term      Preterm      AB  1   Living        SAB  1   TAB      Ectopic      Multiple      Live Births               Home Medications    Prior to Admission medications   Medication Sig Start Date End Date Taking? Authorizing Provider  cetirizine (ZYRTEC) 10 MG tablet Take 10 mg by mouth daily.    [provider]  Elastic Bandages & Supports (COMFORT FIT MATERNITY SUPP SM) MISC 1 Units by Does not apply route daily as needed. 02/26/19   Raelyn Mora, CNM  fluticasone (FLONASE) 50 MCG/ACT nasal spray Place 1 spray into both nostrils daily. 03/14/19   Hall-Potvin, [place], PA-C  labetalol (NORMODYNE)  200 MG tablet Take 200 mg by mouth 2 (two) times daily.    [provider]  Prenatal Vit-Fe Fumarate-FA (PRENATAL MULTIVITAMIN) TABS tablet Take 1 tablet by mouth daily at 12 noon.    [provider]    Family History Family History  Problem Relation Age of Onset  . Hypertension Mother   . Diabetes Mother     Social History Social History   Tobacco Use  . Smoking status: Never Smoker  . Smokeless tobacco: Never Used  Substance Use Topics  . Alcohol use: No  . Drug use: No     Allergies   Peanut oil and Shellfish allergy   Review of Systems Review of Systems  Constitutional: Negative for fatigue and fever.  HENT: Positive for ear pain. Negative for congestion, dental problem, facial swelling, hearing loss, sinus pain, sore throat, tinnitus, trouble swallowing and voice change.   Eyes: Negative for photophobia, pain, redness and visual disturbance.  Respiratory: Negative for cough, chest tightness and shortness of breath.   Cardiovascular: Negative for chest pain and palpitations.  Gastrointestinal: Negative for abdominal pain, diarrhea and vomiting.  Musculoskeletal: Negative for arthralgias and myalgias.  Skin:  Negative for rash and wound.  Neurological: Negative for dizziness, syncope and headaches.     Physical Exam Triage Vital Signs ED Triage Vitals [03/14/19 1119]  Enc Vitals Group     BP 129/72     Pulse Rate 99     Resp 18     Temp 98 F (36.7 C)     Temp Source Oral     SpO2 97 %     Weight      Height      Head Circumference      Peak Flow      Pain Score 2     Pain Loc      Pain Edu?      Excl. in [place]?    No data found.  Updated Vital Signs BP 129/72 (BP Location: Left Arm)   Pulse 99   Temp 98 F (36.7 C) (Oral)   Resp 18   SpO2 97%   Physical Exam Constitutional:      General: She is not in acute distress.    Appearance: She is not ill-appearing.  HENT:     Head: Normocephalic and atraumatic.     Jaw: There is  normal jaw occlusion. No tenderness or pain on movement.     Right Ear: Hearing, tympanic membrane, ear canal and external ear normal. No tenderness. No mastoid tenderness.     Left Ear: Hearing, tympanic membrane, ear canal and external ear normal. No tenderness. No mastoid tenderness.     Ears:     Comments: Negative tragal tenderness bilaterally.  No TM perforation, suppurativa bilaterally    Nose: No nasal deformity, septal deviation or nasal tenderness.     Right Turbinates: Not swollen or pale.     Left Turbinates: Not swollen or pale.     Right Sinus: No maxillary sinus tenderness or frontal sinus tenderness.     Left Sinus: No maxillary sinus tenderness or frontal sinus tenderness.     Comments: Gross bilateral turbinate edema with injected mucosa    Mouth/Throat:     Lips: Pink. No lesions.     Mouth: Mucous membranes are moist. No injury.     Pharynx: Oropharynx is clear. Uvula midline. No posterior oropharyngeal erythema or uvula swelling.     Comments: no tonsillar exudate or hypertrophy Eyes:     General: No scleral icterus.    Extraocular Movements: Extraocular movements intact.     Conjunctiva/sclera: Conjunctivae normal.     Pupils: Pupils are equal, round, and reactive to light.  Neck:     Musculoskeletal: Normal range of motion and neck supple. No muscular tenderness.  Cardiovascular:     Rate and Rhythm: Normal rate and regular rhythm.     Heart sounds: No murmur. No gallop.   Pulmonary:     Effort: Pulmonary effort is normal. No respiratory distress.     Breath sounds: No wheezing.  Lymphadenopathy:     Cervical: No cervical adenopathy.  Skin:    Capillary Refill: Capillary refill takes less than 2 seconds.     Coloration: Skin is not jaundiced or pale.  Neurological:     Mental Status: She is alert and oriented to person, place, and time.      UC Treatments / Results  Labs (all labs ordered are listed, but only abnormal results are displayed) Labs  Reviewed - No data to display  EKG   Radiology No results found.  Procedures Procedures (including critical care time)  Medications Ordered in  UC Medications - No data to display  Initial Impression / Assessment and Plan / UC Course  I have reviewed the triage vital signs and the nursing notes.  Pertinent labs & imaging results that were available during my care of the patient were reviewed by me and considered in my medical decision making (see chart for details).     1.  Bilateral ear pain Likely second to eustachian tube dysfunction.  Will trial intranasal steroid, continue daily antihistamine, and add supportive measures as outlined below.  Return precautions discussed, patient verbalized understanding.  Provided patient with OTC medications safe in pregnancy.  Return precautions discussed, patient verbalized understanding and is agreeable to plan. Final Clinical Impressions(s) / UC Diagnoses   Final diagnoses:  Ear pain, bilateral     Discharge Instructions     Use 2 sprays of Flonase once daily.  Continue using your allergy medication as well. Important to drink plenty of water throughout the day. May use warm, steamy showers, humidifier for additional relief. Return for worsening ear pain, pain with chewing, fever, facial pain.    ED Prescriptions    Medication Sig Dispense Auth. Provider   fluticasone (FLONASE) 50 MCG/ACT nasal spray Place 1 spray into both nostrils daily. 16 g Hall-Potvin, [place], PA-C     PDMP not reviewed this encounter.   Hall-Potvin, [place], [place] 03/14/19 1144

## 2019-03-14 NOTE — Discharge Instructions (Addendum)
Use 2 sprays of Flonase once daily.  Continue using your allergy medication as well. Important to drink plenty of water throughout the day. May use warm, steamy showers, humidifier for additional relief. Return for worsening ear pain, pain with chewing, fever, facial pain.

## 2019-03-14 NOTE — ED Triage Notes (Signed)
Pt states she is [redacted]wks pregnant. C/o bilateral ear pain and drainage, mainly at bedtime for over 3wks.

## 2019-03-15 ENCOUNTER — Institutional Professional Consult (permissible substitution): Payer: Self-pay | Admitting: Pediatrics

## 2019-04-20 LAB — OB RESULTS CONSOLE GBS: GBS: NEGATIVE

## 2019-05-05 ENCOUNTER — Telehealth (HOSPITAL_COMMUNITY): Payer: Self-pay | Admitting: *Deleted

## 2019-05-05 ENCOUNTER — Encounter (HOSPITAL_COMMUNITY): Payer: Self-pay | Admitting: *Deleted

## 2019-05-05 NOTE — Telephone Encounter (Signed)
Preadmission screen  

## 2019-05-09 ENCOUNTER — Other Ambulatory Visit (HOSPITAL_COMMUNITY)
Admission: RE | Admit: 2019-05-09 | Discharge: 2019-05-09 | Disposition: A | Discharge status: Home / Self Care | Payer: Medicaid Other | Source: Ambulatory Visit | Attending: Obstetrics and Gynecology | Admitting: Obstetrics and Gynecology

## 2019-05-09 DIAGNOSIS — Z01812 Encounter for preprocedural laboratory examination: Secondary | ICD-10-CM | POA: Insufficient documentation

## 2019-05-09 DIAGNOSIS — Z20828 Contact with and (suspected) exposure to other viral communicable diseases: Secondary | ICD-10-CM | POA: Diagnosis not present

## 2019-05-09 LAB — SARS CORONAVIRUS 2 (TAT 6-24 HRS): SARS Coronavirus 2: NEGATIVE

## 2019-05-10 NOTE — H&P (Signed)
Kaitlyn Noble is a 28 y.o. female presenting for IOL s/s cHTN. ON Labetalol 200mg  BID and baby ASA this pregnancy. Hx pcos and was on metformin until 14 wga. Hx uterine septum resection by dr. Darreld Mclean. Expecting a boy.  OB History    Gravida  2   Para      Term      Preterm      AB  1   Living        SAB  1   TAB      Ectopic      Multiple      Live Births             Past Medical History:  Diagnosis Date  . Chronic hypertension   . Costochondritis    history  . Dyspnea    Occasional-history  . Headache    Migraines  . PCOS (polycystic ovarian syndrome)    Past Surgical History:  Procedure Laterality Date  . DILATION AND EVACUATION N/A 12/22/2016   Procedure: DILATATION AND EVACUATION  with ultrasound guidance;  Surgeon: Tyson Dense, MD;  Location: [facility];  Service: Gynecology;  Laterality: N/A;  . SHOULDER SURGERY Left   . UTERINE SEPTUM RESECTION    . WISDOM TOOTH EXTRACTION     Family History: family history includes Diabetes in her mother; Hypertension in her mother. Social History:  reports that she has never smoked. She has never used smokeless tobacco. She reports that she does not drink alcohol or use drugs.     Maternal Diabetes: No Genetic Screening: Normal Maternal Ultrasounds/Referrals: Normal Fetal Ultrasounds or other Referrals:  None Maternal Substance Abuse:  No Significant Maternal Medications:  None Significant Maternal Lab Results:  None Other Comments:  None  ROS History   There were no vitals taken for this visit. Exam Physical Exam  (from office) NAD, A&O NWOB Abd soft, nondistended, gravid SVE closed  Prenatal labs: ABO, Rh: O/Positive/-- (04/27 0000) Antibody: Negative (04/27 0000) Rubella: Immune (04/27 0000) RPR: Nonreactive (04/27 0000)  HBsAg: Negative (04/27 0000)  HIV: Non-reactive (04/27 0000)  GBS: Negative/-- (10/29 0000)   Assessment/Plan: 28 yo G2P0010 @ 38.2 wga presenting for IOL s/s cHTN.   Continue Labetalol 200mg  BID. Cervix unfavorable. Plan for cytotec followed by pitocin/AROM when more favorable.  GBS negative    Tyson Dense 05/10/2019, 1:34 PM

## 2019-05-11 ENCOUNTER — Inpatient Hospital Stay (HOSPITAL_COMMUNITY): Payer: Medicaid Other | Admitting: Anesthesiology

## 2019-05-11 ENCOUNTER — Other Ambulatory Visit: Payer: Self-pay

## 2019-05-11 ENCOUNTER — Encounter (HOSPITAL_COMMUNITY): Payer: Self-pay

## 2019-05-11 ENCOUNTER — Inpatient Hospital Stay (HOSPITAL_COMMUNITY)
Admission: AD | Admit: 2019-05-11 | Discharge: 2019-05-13 | DRG: 807 | Disposition: A | Discharge status: Home / Self Care | Payer: Medicaid Other | Attending: Obstetrics and Gynecology | Admitting: Obstetrics and Gynecology

## 2019-05-11 ENCOUNTER — Inpatient Hospital Stay (HOSPITAL_COMMUNITY): Payer: Medicaid Other

## 2019-05-11 DIAGNOSIS — E282 Polycystic ovarian syndrome: Secondary | ICD-10-CM | POA: Diagnosis present

## 2019-05-11 DIAGNOSIS — Z3490 Encounter for supervision of normal pregnancy, unspecified, unspecified trimester: Secondary | ICD-10-CM

## 2019-05-11 DIAGNOSIS — O1002 Pre-existing essential hypertension complicating childbirth: Principal | ICD-10-CM | POA: Diagnosis present

## 2019-05-11 DIAGNOSIS — Z3A38 38 weeks gestation of pregnancy: Secondary | ICD-10-CM | POA: Diagnosis not present

## 2019-05-11 HISTORY — DX: Encounter for supervision of normal pregnancy, unspecified, unspecified trimester: Z34.90

## 2019-05-11 LAB — COMPREHENSIVE METABOLIC PANEL
ALT: 19 U/L (ref 0–44)
AST: 21 U/L (ref 15–41)
Albumin: 3.2 g/dL — ABNORMAL LOW (ref 3.5–5.0)
Alkaline Phosphatase: 91 U/L (ref 38–126)
Anion gap: 9 (ref 5–15)
BUN: 6 mg/dL (ref 6–20)
CO2: 20 mmol/L — ABNORMAL LOW (ref 22–32)
Calcium: 9.1 mg/dL (ref 8.9–10.3)
Chloride: 105 mmol/L (ref 98–111)
Creatinine, Ser: 0.64 mg/dL (ref 0.44–1.00)
GFR calc Af Amer: >60 mL/min (ref >60)
GFR calc non Af Amer: >60 mL/min (ref >60)
Glucose, Bld: 99 mg/dL (ref 70–99)
Potassium: 3.7 mmol/L (ref 3.5–5.1)
Sodium: 134 mmol/L — ABNORMAL LOW (ref 135–145)
Total Bilirubin: 0.5 mg/dL (ref 0.3–1.2)
Total Protein: 6 g/dL — ABNORMAL LOW (ref 6.5–8.1)

## 2019-05-11 LAB — CBC
HCT: 36.9 % (ref 36.0–46.0)
HCT: 39.3 % (ref 36.0–46.0)
HCT: 37.6 % (ref 36.0–46.0)
Hemoglobin: 12.5 g/dL (ref 12.0–15.0)
Hemoglobin: 13.3 g/dL (ref 12.0–15.0)
Hemoglobin: 13 g/dL (ref 12.0–15.0)
MCH: 32.7 pg (ref 26.0–34.0)
MCH: 33 pg (ref 26.0–34.0)
MCH: 33.2 pg (ref 26.0–34.0)
MCHC: 33.9 g/dL (ref 30.0–36.0)
MCHC: 33.8 g/dL (ref 30.0–36.0)
MCHC: 34.6 g/dL (ref 30.0–36.0)
MCV: 96.6 fL (ref 80.0–100.0)
MCV: 97.5 fL (ref 80.0–100.0)
MCV: 95.9 fL (ref 80.0–100.0)
Platelets: 157 10*3/uL (ref 150–400)
Platelets: 158 10*3/uL (ref 150–400)
Platelets: 159 10*3/uL (ref 150–400)
RBC: 3.82 MIL/uL — ABNORMAL LOW (ref 3.87–5.11)
RBC: 4.03 MIL/uL (ref 3.87–5.11)
RBC: 3.92 MIL/uL (ref 3.87–5.11)
RDW: 13.1 % (ref 11.5–15.5)
RDW: 13.2 % (ref 11.5–15.5)
RDW: 13.1 % (ref 11.5–15.5)
WBC: 6.9 10*3/uL (ref 4.0–10.5)
WBC: 9.1 10*3/uL (ref 4.0–10.5)
WBC: 10.3 10*3/uL (ref 4.0–10.5)
nRBC: 0 % (ref 0.0–0.2)
nRBC: 0 % (ref 0.0–0.2)
nRBC: 0 % (ref 0.0–0.2)

## 2019-05-11 LAB — RPR: RPR Ser Ql: NONREACTIVE

## 2019-05-11 LAB — TYPE AND SCREEN
ABO/RH(D): O POS
Antibody Screen: NEGATIVE

## 2019-05-11 LAB — ABO/RH: ABO/RH(D): O POS

## 2019-05-11 MED ORDER — MISOPROSTOL 25 MCG QUARTER TABLET
25.0000 ug | ORAL_TABLET | ORAL | Status: DC | PRN
  Administered 2019-05-11 (×2): 25 ug via VAGINAL
  Filled 2019-05-11 (×2): qty 1

## 2019-05-11 MED ORDER — SODIUM CHLORIDE (PF) 0.9 % IJ SOLN
INTRAMUSCULAR | Status: DC | PRN
  Administered 2019-05-11: 12 mL/h via EPIDURAL

## 2019-05-11 MED ORDER — OXYCODONE-ACETAMINOPHEN 5-325 MG PO TABS: 1.0000 | ORAL_TABLET | ORAL | Status: DC | PRN

## 2019-05-11 MED ORDER — OXYTOCIN 40 UNITS IN NORMAL SALINE INFUSION - SIMPLE MED
1.0000 m[IU]/min | INTRAVENOUS | Status: DC
  Administered 2019-05-11: 8 m[IU]/min via INTRAVENOUS
  Administered 2019-05-11: 2 m[IU]/min via INTRAVENOUS
  Filled 2019-05-11: qty 1000

## 2019-05-11 MED ORDER — ACETAMINOPHEN 325 MG PO TABS: 650.0000 mg | ORAL_TABLET | ORAL | Status: DC | PRN

## 2019-05-11 MED ORDER — LIDOCAINE HCL (PF) 1 % IJ SOLN: 30.0000 mL | INTRAMUSCULAR | Status: DC | PRN

## 2019-05-11 MED ORDER — SOD CITRATE-CITRIC ACID 500-334 MG/5ML PO SOLN: 30.0000 mL | ORAL | Status: DC | PRN

## 2019-05-11 MED ORDER — ONDANSETRON HCL 4 MG/2ML IJ SOLN
4.0000 mg | Freq: Four times a day (QID) | INTRAMUSCULAR | Status: DC | PRN
  Administered 2019-05-11 (×2): 4 mg via INTRAVENOUS
  Filled 2019-05-11 (×2): qty 2

## 2019-05-11 MED ORDER — FENTANYL-BUPIVACAINE-NACL 0.5-0.125-0.9 MG/250ML-% EP SOLN
EPIDURAL | Status: AC
  Filled 2019-05-11: qty 250

## 2019-05-11 MED ORDER — BUTORPHANOL TARTRATE 1 MG/ML IJ SOLN
1.0000 mg | INTRAMUSCULAR | Status: DC | PRN
  Administered 2019-05-11 (×2): 1 mg via INTRAVENOUS
  Filled 2019-05-11 (×2): qty 1

## 2019-05-11 MED ORDER — LACTATED RINGERS IV SOLN
INTRAVENOUS | Status: DC
  Administered 2019-05-11: 01:00:00 via INTRAVENOUS

## 2019-05-11 MED ORDER — HYDROXYZINE HCL 50 MG PO TABS: 50.0000 mg | ORAL_TABLET | Freq: Four times a day (QID) | ORAL | Status: DC | PRN

## 2019-05-11 MED ORDER — LIDOCAINE HCL (PF) 1 % IJ SOLN
INTRAMUSCULAR | Status: DC | PRN
  Administered 2019-05-11: 6 mL via EPIDURAL

## 2019-05-11 MED ORDER — OXYCODONE-ACETAMINOPHEN 5-325 MG PO TABS: 2.0000 | ORAL_TABLET | ORAL | Status: DC | PRN

## 2019-05-11 MED ORDER — OXYTOCIN 40 UNITS IN NORMAL SALINE INFUSION - SIMPLE MED: 2.5000 [IU]/h | INTRAVENOUS | Status: DC

## 2019-05-11 MED ORDER — OXYTOCIN BOLUS FROM INFUSION
500.0000 mL | Freq: Once | INTRAVENOUS | Status: AC
  Administered 2019-05-11: 500 mL via INTRAVENOUS

## 2019-05-11 MED ORDER — LABETALOL HCL 200 MG PO TABS
200.0000 mg | ORAL_TABLET | Freq: Two times a day (BID) | ORAL | Status: DC
  Administered 2019-05-11 – 2019-05-13 (×5): 200 mg via ORAL
  Filled 2019-05-11 (×5): qty 1

## 2019-05-11 MED ORDER — LACTATED RINGERS IV SOLN: 500.0000 mL | INTRAVENOUS | Status: DC | PRN

## 2019-05-11 MED ORDER — TERBUTALINE SULFATE 1 MG/ML IJ SOLN: 0.2500 mg | Freq: Once | INTRAMUSCULAR | Status: DC | PRN

## 2019-05-11 MED ORDER — ZOLPIDEM TARTRATE 5 MG PO TABS: 5.0000 mg | ORAL_TABLET | Freq: Every evening | ORAL | Status: DC | PRN

## 2019-05-11 NOTE — Progress Notes (Signed)
Feeling cramping. Unsure re: CLE. SVE 2/70/-2, AROM for clear fluid BP reassuring - no s/s SIPE. Start pitocin 4 hours after last cytotec   Arty Baumgartner MD

## 2019-05-11 NOTE — Progress Notes (Signed)
9.5/100/+1 DOP still - manual rotation unsuccessful

## 2019-05-11 NOTE — Anesthesia Preprocedure Evaluation (Signed)
Anesthesia Evaluation  Patient identified by MRN, date of birth, ID band Patient awake    Reviewed: Allergy & Precautions, H&P , NPO status , Patient's Chart, lab work & pertinent test results, reviewed documented beta blocker date and time   Airway Mallampati: II  TM Distance: >3 FB Neck ROM: full    Dental no notable dental hx. (+) Teeth Intact, Dental Advisory Given   Pulmonary neg pulmonary ROS,    Pulmonary exam normal breath sounds clear to auscultation       Cardiovascular hypertension, negative cardio ROS Normal cardiovascular exam Rhythm:regular Rate:Normal     Neuro/Psych negative neurological ROS  negative psych ROS   GI/Hepatic negative GI ROS, Neg liver ROS,   Endo/Other  negative endocrine ROS  Renal/GU negative Renal ROS  negative genitourinary   Musculoskeletal   Abdominal   Peds  Hematology negative hematology ROS (+)   Anesthesia Other Findings   Reproductive/Obstetrics (+) Pregnancy                             Anesthesia Physical Anesthesia Plan  ASA: III  Anesthesia Plan: Epidural   Post-op Pain Management:    Induction:   PONV Risk Score and Plan:   Airway Management Planned:   Additional Equipment:   Intra-op Plan:   Post-operative Plan:   Informed Consent: I have reviewed the patients History and Physical, chart, labs and discussed the procedure including the risks, benefits and alternatives for the proposed anesthesia with the patient or authorized representative who has indicated his/her understanding and acceptance.     Dental Advisory Given  Plan Discussed with:   Anesthesia Plan Comments: (Labs checked- platelets confirmed with RN in room. Fetal heart tracing, per RN, reported to be stable enough for sitting procedure. Discussed epidural, and patient consents to the procedure:  included risk of possible headache,backache, failed block,  allergic reaction, and nerve injury. This patient was asked if she had any questions or concerns before the procedure started.)        Anesthesia Quick Evaluation

## 2019-05-11 NOTE — Anesthesia Procedure Notes (Signed)
Epidural Patient location during procedure: OB Start time: 05/11/2019 1:00 PM End time: 05/11/2019 1:08 PM  Staffing Anesthesiologist: Janeece Riggers, MD  Preanesthetic Checklist Completed: patient identified, site marked, surgical consent, pre-op evaluation, timeout performed, IV checked, risks and benefits discussed and monitors and equipment checked  Epidural Patient position: sitting Prep: site prepped and draped and DuraPrep Patient monitoring: continuous pulse ox and blood pressure Approach: midline Location: L3-L4 Injection technique: LOR air  Needle:  Needle type: Tuohy  Needle gauge: 17 G Needle length: 9 cm and 9 Needle insertion depth: 9 cm Catheter type: closed end flexible Catheter size: 19 Gauge Catheter at skin depth: 14 cm Test dose: negative  Assessment Events: blood not aspirated, injection not painful, no injection resistance, negative IV test and no paresthesia

## 2019-05-11 NOTE — Progress Notes (Signed)
C/c/+2, push

## 2019-05-11 NOTE — Progress Notes (Signed)
SVE 6/100/-2 DOP IUPC placed to help trace ctxn Peanut ball

## 2019-05-12 ENCOUNTER — Encounter (HOSPITAL_COMMUNITY): Payer: Self-pay

## 2019-05-12 LAB — CBC
HCT: 35.6 % — ABNORMAL LOW (ref 36.0–46.0)
Hemoglobin: 12.1 g/dL (ref 12.0–15.0)
MCH: 33 pg (ref 26.0–34.0)
MCHC: 34 g/dL (ref 30.0–36.0)
MCV: 97 fL (ref 80.0–100.0)
Platelets: 167 10*3/uL (ref 150–400)
RBC: 3.67 MIL/uL — ABNORMAL LOW (ref 3.87–5.11)
RDW: 13.2 % (ref 11.5–15.5)
WBC: 10.6 10*3/uL — ABNORMAL HIGH (ref 4.0–10.5)
nRBC: 0 % (ref 0.0–0.2)

## 2019-05-12 MED ORDER — PRENATAL MULTIVITAMIN CH
1.0000 | ORAL_TABLET | Freq: Every day | ORAL | Status: DC
  Administered 2019-05-12: 1 via ORAL
  Filled 2019-05-12: qty 1

## 2019-05-12 MED ORDER — ONDANSETRON HCL 4 MG/2ML IJ SOLN: 4.0000 mg | INTRAMUSCULAR | Status: DC | PRN

## 2019-05-12 MED ORDER — TETANUS-DIPHTH-ACELL PERTUSSIS 5-2.5-18.5 LF-MCG/0.5 IM SUSP: 0.5000 mL | Freq: Once | INTRAMUSCULAR | Status: DC

## 2019-05-12 MED ORDER — LORATADINE 10 MG PO TABS
10.0000 mg | ORAL_TABLET | Freq: Every day | ORAL | Status: DC
  Administered 2019-05-12 – 2019-05-13 (×2): 10 mg via ORAL
  Filled 2019-05-12 (×2): qty 1

## 2019-05-12 MED ORDER — BENZOCAINE-MENTHOL 20-0.5 % EX AERO
1.0000 "application " | INHALATION_SPRAY | CUTANEOUS | Status: DC | PRN
  Administered 2019-05-12: 1 via TOPICAL
  Filled 2019-05-12 (×2): qty 56

## 2019-05-12 MED ORDER — ZOLPIDEM TARTRATE 5 MG PO TABS: 5.0000 mg | ORAL_TABLET | Freq: Every evening | ORAL | Status: DC | PRN

## 2019-05-12 MED ORDER — SENNOSIDES-DOCUSATE SODIUM 8.6-50 MG PO TABS
2.0000 | ORAL_TABLET | ORAL | Status: DC
  Administered 2019-05-12 (×2): 2 via ORAL
  Filled 2019-05-12 (×2): qty 2

## 2019-05-12 MED ORDER — ACETAMINOPHEN 325 MG PO TABS
650.0000 mg | ORAL_TABLET | ORAL | Status: DC | PRN
  Administered 2019-05-12: 650 mg via ORAL
  Filled 2019-05-12: qty 2

## 2019-05-12 MED ORDER — COCONUT OIL OIL
1.0000 "application " | TOPICAL_OIL | Status: DC | PRN
  Administered 2019-05-12: 1 via TOPICAL

## 2019-05-12 MED ORDER — ONDANSETRON HCL 4 MG PO TABS: 4.0000 mg | ORAL_TABLET | ORAL | Status: DC | PRN

## 2019-05-12 MED ORDER — SIMETHICONE 80 MG PO CHEW: 80.0000 mg | CHEWABLE_TABLET | ORAL | Status: DC | PRN

## 2019-05-12 MED ORDER — DIPHENHYDRAMINE HCL 25 MG PO CAPS: 25.0000 mg | ORAL_CAPSULE | Freq: Four times a day (QID) | ORAL | Status: DC | PRN

## 2019-05-12 MED ORDER — DIBUCAINE (PERIANAL) 1 % EX OINT: 1.0000 "application " | TOPICAL_OINTMENT | CUTANEOUS | Status: DC | PRN

## 2019-05-12 MED ORDER — IBUPROFEN 600 MG PO TABS
600.0000 mg | ORAL_TABLET | Freq: Four times a day (QID) | ORAL | Status: DC
  Administered 2019-05-12 – 2019-05-13 (×6): 600 mg via ORAL
  Filled 2019-05-12 (×6): qty 1

## 2019-05-12 MED ORDER — OXYCODONE HCL 5 MG PO TABS: 10.0000 mg | ORAL_TABLET | ORAL | Status: DC | PRN

## 2019-05-12 MED ORDER — WITCH HAZEL-GLYCERIN EX PADS: 1.0000 "application " | MEDICATED_PAD | CUTANEOUS | Status: DC | PRN

## 2019-05-12 MED ORDER — OXYCODONE HCL 5 MG PO TABS: 5.0000 mg | ORAL_TABLET | ORAL | Status: DC | PRN

## 2019-05-12 NOTE — Lactation Note (Addendum)
This note was copied from a baby's chart. Lactation Consultation Note Baby 60 hrs old. Mom stated baby has fed ok. Stated it hurt this morning when he fed he was making noises. Mom has short shaft everted nipples. Breast bouncy. Hand expression w/colostrum noted. Shells and hand pump given for pre-pumping prior to latching. Attempted to latch in football position. Discussed positioning and support. Baby latched. Suckling on top lip. Taught mom how to flange lips. Baby suckled better. Baby has labial frenulum. Baby went to sleep. Mom stated baby has had low temps she has been doing STS. Praised mom. Mom can hand express colostrum. Encouraged breast massage at intervals. Newborn behavior, STS, I&O, milk storage, supply and demand discussed. Mom encouraged to feed baby 8-12 times/24 hours and with feeding cues.  Baby very jittery to extremities. Mom stated he hasn't been that way. Called to desk to notify RN. Encouraged mom to call for assistance or questions. Lactation brochure given.  Patient Name: [name] Date: 05/12/2019 Reason for consult: Initial assessment;Primapara;Early term 37-38.6wks   Maternal Data Has patient been taught Hand Expression?: Yes Does the patient have breastfeeding experience prior to this delivery?: No  Feeding    LATCH Score Latch: Too sleepy or reluctant, no latch achieved, no sucking elicited.  Audible Swallowing: None  Type of Nipple: Inverted  Comfort (Breast/Nipple): Soft / non-tender  Hold (Positioning): Full assist, staff holds infant at breast  LATCH Score: 2  Interventions Interventions: Breast feeding basics reviewed;Adjust position;Assisted with latch;Support pillows;Skin to skin;Position options;Breast massage;Hand express;Breast compression  Lactation Tools Discussed/Used WIC Program: Yes   Consult Status Consult Status: Follow-up Date: 05/13/19 Follow-up type: In-patient    Remonia Otte, [place]ta [place] 05/12/2019, 6:30 AM

## 2019-05-12 NOTE — Progress Notes (Signed)
Post Partum Day 1 Subjective: no complaints, up ad lib, voiding and tolerating PO.  Patient desires circ but baby just taken to nursery for low temp.    Objective: Blood pressure 119/73, pulse 87, temperature 98.4 F (36.9 C), temperature source Oral, resp. rate 18, height 5\' 7"  (1.702 m), weight 105.8 kg, SpO2 100 %, unknown if currently breastfeeding.  Physical Exam:  General: alert, cooperative and appears stated age Lochia: appropriate Uterine Fundus: firm Incision: healing well, no significant drainage, no dehiscence DVT Evaluation: No evidence of DVT seen on physical exam. Negative Homan's sign. No cords or calf tenderness.  Recent Labs    05/11/19 2152 05/12/19 0540  HGB 13.0 12.1  HCT 37.6 35.6*    Assessment/Plan: Plan for discharge tomorrow, Breastfeeding and Circumcision prior to discharge CHTN-BP wnl; labetalol 200 BID Circ-patient counseled for circ including risk of bleeding, infection, and scarring.  All questions were answered.  Will plan to do tomorrow.   Linda Hedges 05/12/2019, 8:52 AM

## 2019-05-12 NOTE — Anesthesia Postprocedure Evaluation (Signed)
Anesthesia Post Note  Patient: Kaitlyn Noble  Procedure(s) Performed: AN AD HOC LABOR EPIDURAL     Patient location during evaluation: Mother Baby Anesthesia Type: Epidural Level of consciousness: awake and alert and oriented Pain management: satisfactory to patient Vital Signs Assessment: post-procedure vital signs reviewed and stable Respiratory status: respiratory function stable Cardiovascular status: stable Postop Assessment: no headache, no backache, epidural receding, patient able to bend at knees, no signs of nausea or vomiting and adequate PO intake Anesthetic complications: no    Last Vitals:  Vitals:   05/12/19 0040 05/12/19 0535  BP: 128/65 119/73  Pulse: (!) 108 87  Resp: 18 18  Temp: 37.3 C 36.9 C  SpO2: 100% 100%    Last Pain:  Vitals:   05/12/19 0535  TempSrc: Oral  PainSc: 3    Pain Goal:                   Kaitlyn Noble

## 2019-05-13 MED ORDER — IBUPROFEN 600 MG PO TABS: 600.0000 mg | ORAL_TABLET | Freq: Four times a day (QID) | ORAL | 0 refills | Status: DC

## 2019-05-13 NOTE — Discharge Instructions (Signed)
Call MD for T>100.4, heavy vaginal bleeding, severe abdominal pain, or respiratory distress.  Call office to schedule postpartum visit in 6 weeks.  Pelvic rest x 6 weeks.   

## 2019-05-13 NOTE — Discharge Summary (Signed)
Obstetric Discharge Summary Reason for Admission: induction of labor at 17 weeks for chronic hyperension Prenatal Procedures: none Intrapartum Procedures: spontaneous vaginal delivery Postpartum Procedures: none Complications-Operative and Postpartum: periurethral laceration Hemoglobin  Date Value Ref Range Status  05/12/2019 12.1 12.0 - 15.0 g/dL Final   HCT  Date Value Ref Range Status  05/12/2019 35.6 (L) 36.0 - 46.0 % Final    Physical Exam:  General: alert, cooperative and appears stated age 5: appropriate Uterine Fundus: firm Incision: healing well, no significant drainage, no dehiscence DVT Evaluation: No evidence of DVT seen on physical exam. Negative Homan's sign. No cords or calf tenderness.  Discharge Diagnoses: Term Pregnancy-delivered and chronic hypertension  Discharge Information: Date: 05/13/2019 Activity: pelvic rest Diet: routine Medications: PNV, Ibuprofen and labetalol Condition: stable Instructions: refer to practice specific booklet Discharge to: home   Newborn Data: Live born female  Birth Weight: 6 lb 14.1 oz (3120 g) APGAR: 8, 9  Newborn Delivery   Birth date/time: 05/11/2019 21:16:00 Delivery type: Vaginal, Spontaneous      Home with mother.  Linda Hedges 05/13/2019, 7:12 AM

## 2019-06-01 ENCOUNTER — Telehealth (HOSPITAL_COMMUNITY): Payer: Self-pay | Admitting: Lactation Services

## 2019-06-01 NOTE — Telephone Encounter (Signed)
Mother would like OP appt.  Message in basket sent

## 2019-06-14 DIAGNOSIS — I10 Essential (primary) hypertension: Secondary | ICD-10-CM | POA: Insufficient documentation

## 2019-06-14 DIAGNOSIS — R87619 Unspecified abnormal cytological findings in specimens from cervix uteri: Secondary | ICD-10-CM | POA: Insufficient documentation

## 2019-06-14 DIAGNOSIS — E282 Polycystic ovarian syndrome: Secondary | ICD-10-CM | POA: Insufficient documentation

## 2019-08-20 IMAGING — CR DG CHEST 2V
2 series · 2 of 2 positions shown · non-contrast
Comparison: 11/21/2014 .

CLINICAL DATA: Persistent cough.

EXAM:
CHEST  2 VIEW

[w chest pa]
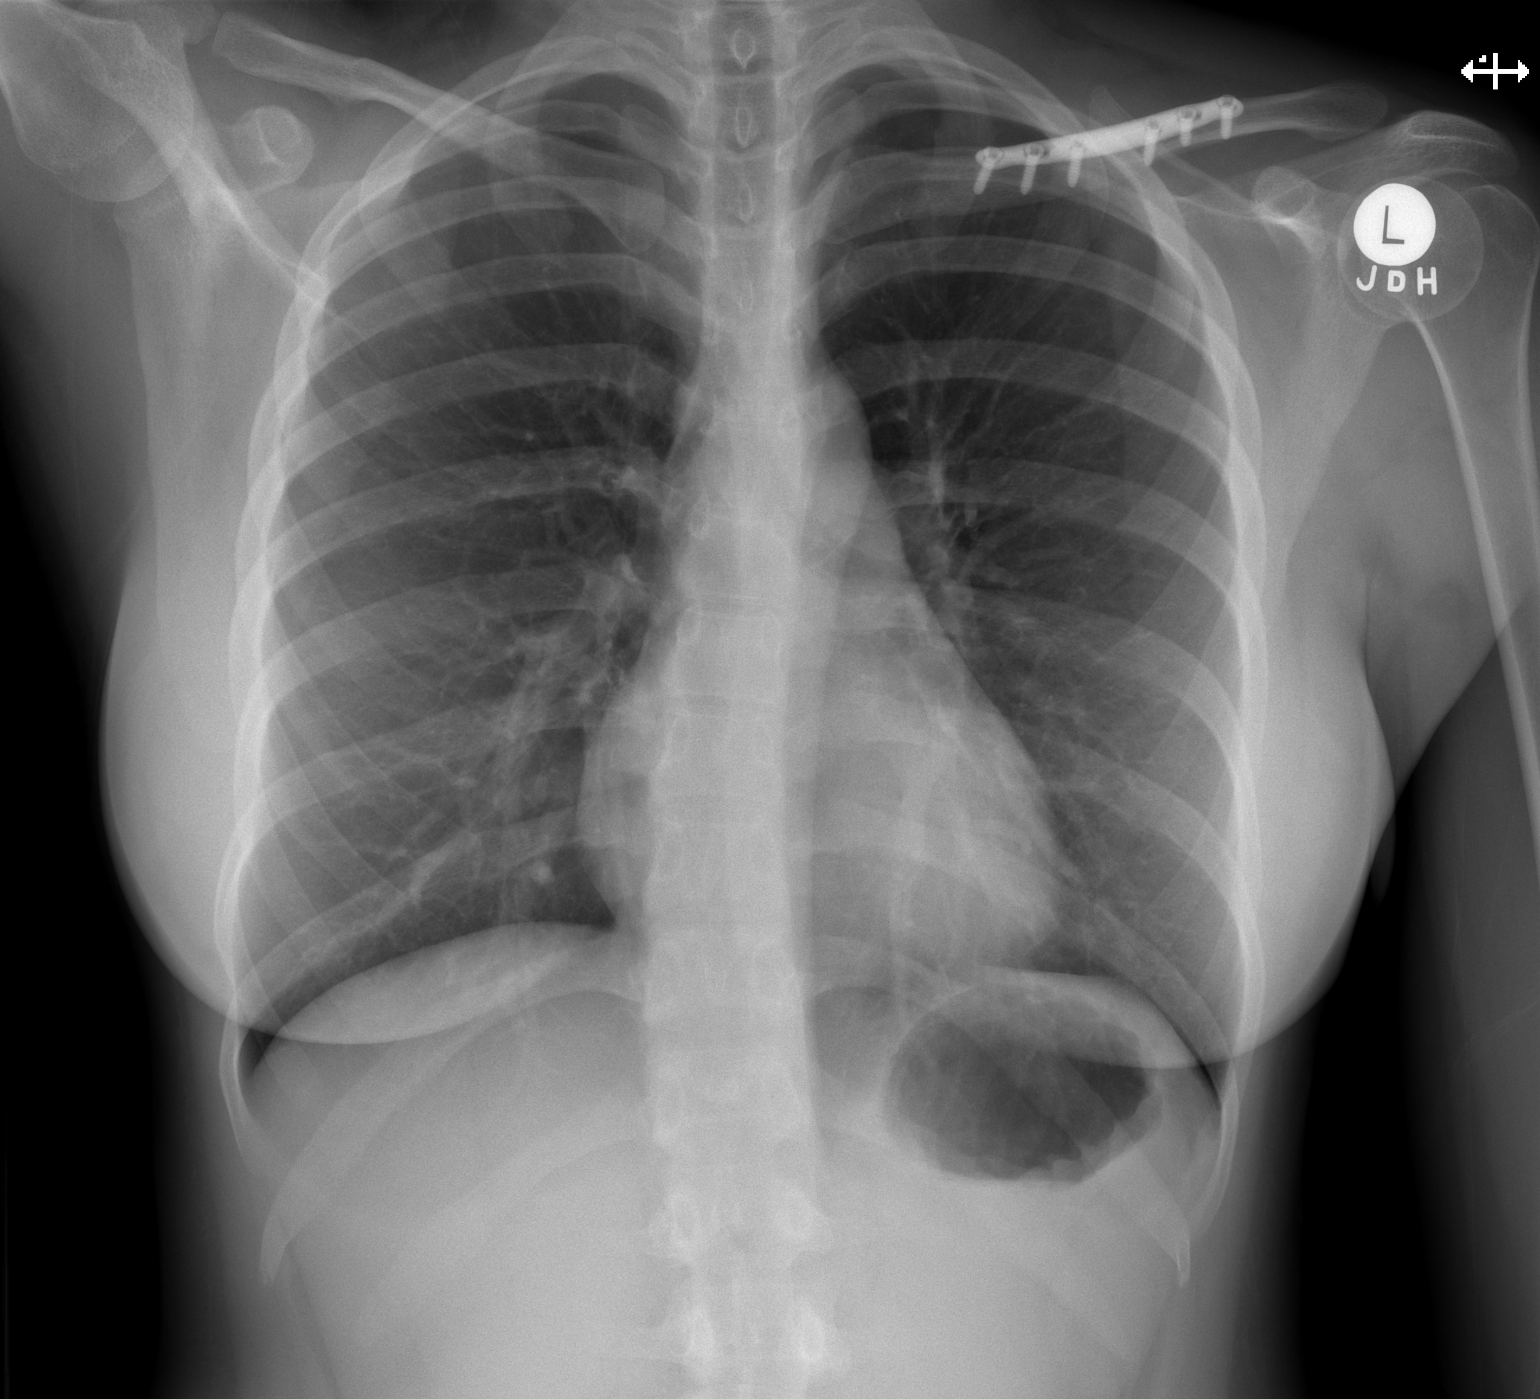

[w chest decub]
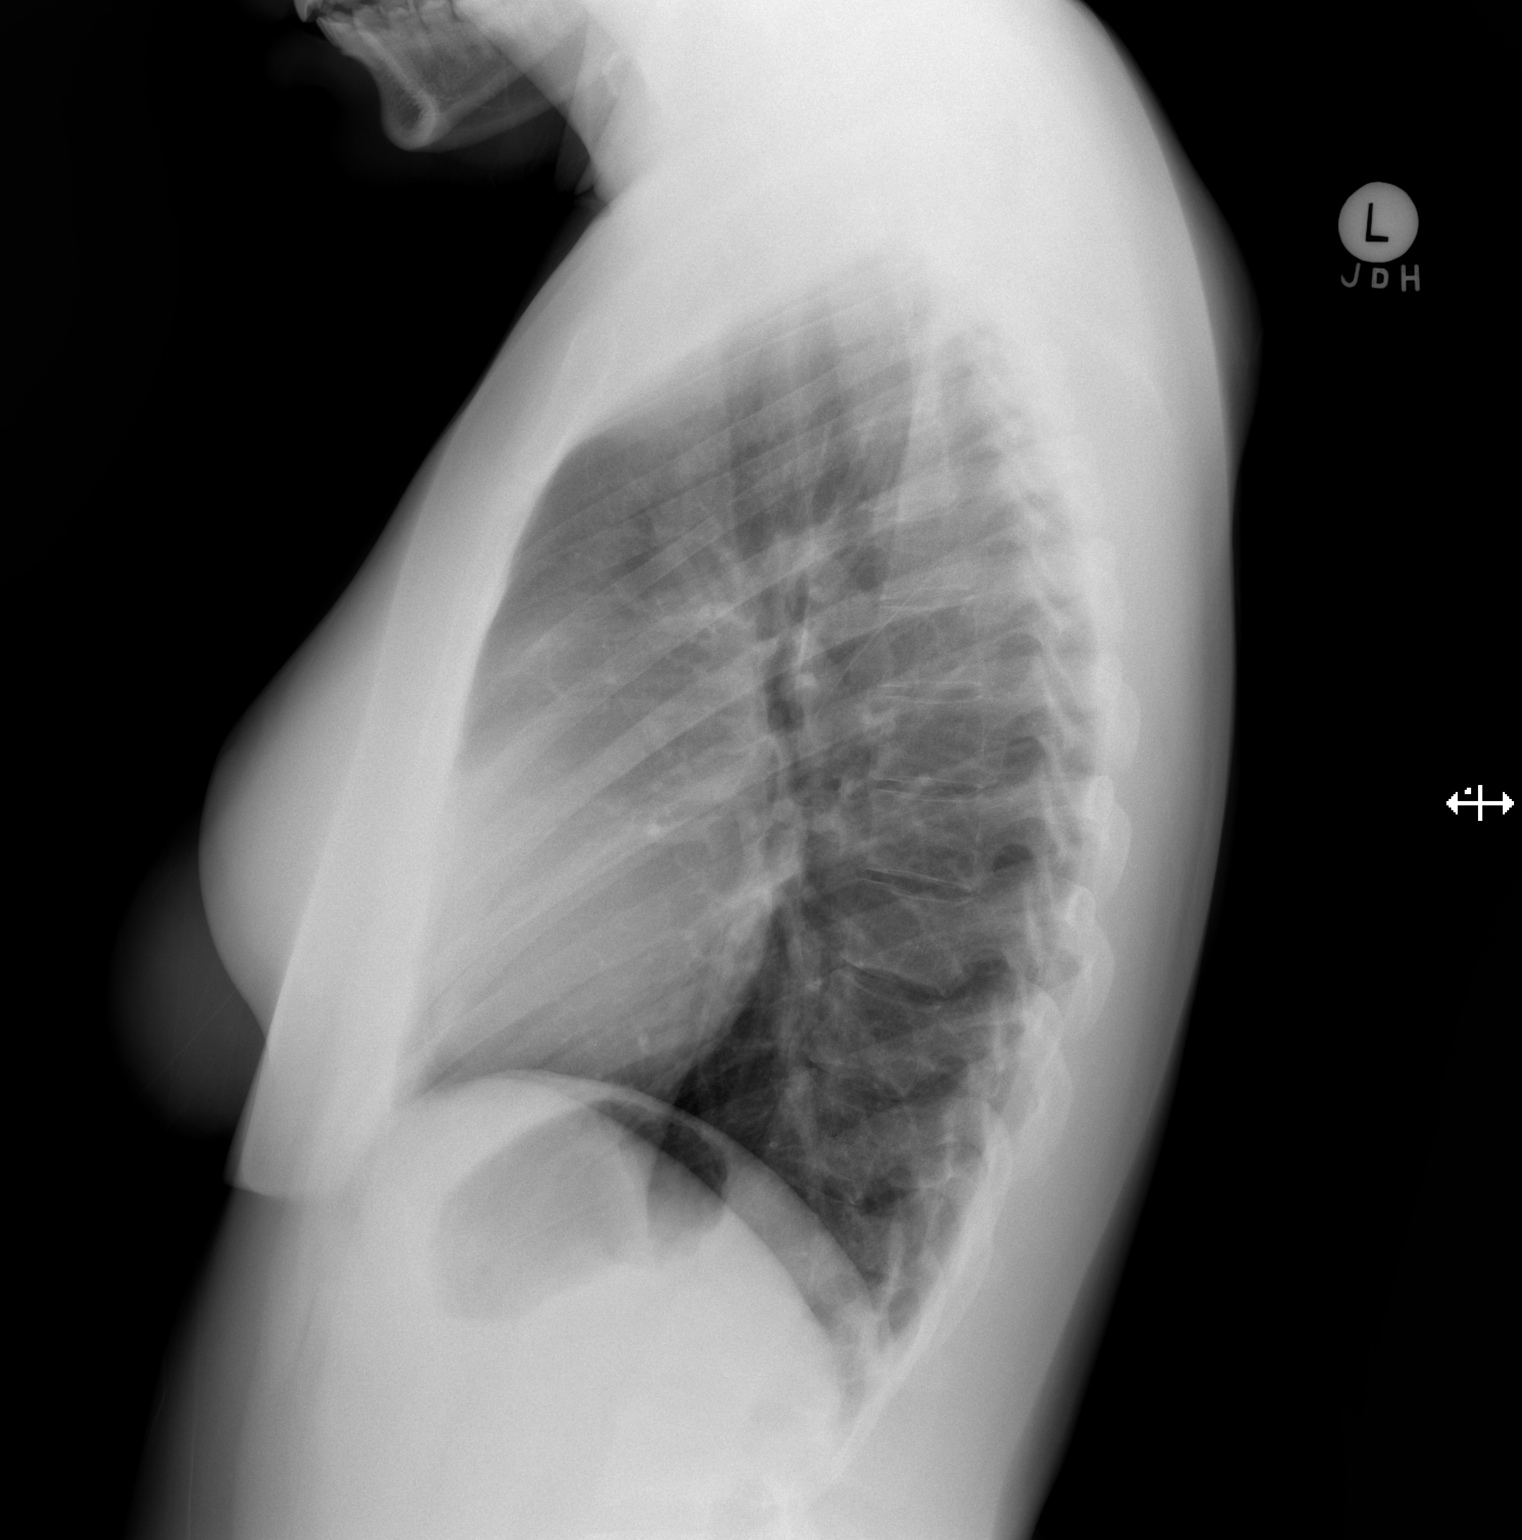

[2 of 2 positions shown; findings below may reference images not displayed]

FINDINGS: Mediastinum and hilar structures normal. Lungs are clear. Heart size
normal. No pleural effusion or pneumothorax. Plate screw fixation of
the left clavicle. Thoracolumbar spine scoliosis .
IMPRESSION: No active cardiopulmonary disease.

## 2019-10-23 ENCOUNTER — Ambulatory Visit
Admission: EM | Admit: 2019-10-23 | Discharge: 2019-10-23 | Disposition: A | Discharge status: Home / Self Care | Payer: Medicaid Other | Attending: Emergency Medicine | Admitting: Emergency Medicine

## 2019-10-23 DIAGNOSIS — R509 Fever, unspecified: Secondary | ICD-10-CM

## 2019-10-23 DIAGNOSIS — N644 Mastodynia: Secondary | ICD-10-CM | POA: Diagnosis not present

## 2019-10-23 NOTE — ED Triage Notes (Signed)
Pt states she is breast feeding, woke up with rt breast pain and had a fever. Pt requesting covid testing.

## 2019-10-23 NOTE — ED Provider Notes (Signed)
[name]    CSN: [id number] Arrival date & time: 10/23/19  1756      History   Chief Complaint Chief Complaint  Patient presents with  . Breast Pain    HPI Kaitlyn Noble is a 29 y.o. female history of hypertension, PCOS, headaches presenting for right breast pain.  States has been ongoing for the last few days, worse today.  Did have a fever this morning, T-max 101.  Has taken ibuprofen without relief.  No change in appearance of breast, nipple, nipple retraction, discharge.  Is currently breast-feeding her 83-month-old: Recently stopped producing as much milk and her child has been sleeping through the night.  Patient denies chest pain, cough, nasal congestion, abdominal pain, diarrhea.   Past Medical History:  Diagnosis Date  . Chronic hypertension   . Costochondritis    history  . Dyspnea    Occasional-history  . Headache    Migraines  . PCOS (polycystic ovarian syndrome)     Patient Active Problem List   Diagnosis Date Noted  . Pregnant 05/11/2019  . Candida vaginitis 02/26/2019  . Round ligament pain 02/26/2019    Past Surgical History:  Procedure Laterality Date  . DILATION AND EVACUATION N/A 12/22/2016   Procedure: DILATATION AND EVACUATION  with ultrasound guidance;  Surgeon: Ranae Pila, MD;  Location: [facility];  Service: Gynecology;  Laterality: N/A;  . SHOULDER SURGERY Left   . UTERINE SEPTUM RESECTION    . WISDOM TOOTH EXTRACTION      OB History    Gravida  2   Para  1   Term  1   Preterm      AB  1   Living  1     SAB  1   TAB      Ectopic      Multiple  0   Live Births  1            Home Medications    Prior to Admission medications   Medication Sig Start Date End Date Taking? Authorizing Provider  cetirizine (ZYRTEC) 10 MG tablet Take 10 mg by mouth daily.    [provider]  Elastic Bandages & Supports (COMFORT FIT MATERNITY SUPP SM) MISC 1 Units by Does not apply route daily as  needed. 02/26/19   Raelyn Mora, CNM  fluticasone (FLONASE) 50 MCG/ACT nasal spray Place 1 spray into both nostrils daily. 03/14/19   Hall-Potvin, [place], PA-C  ibuprofen (ADVIL) 600 MG tablet Take 1 tablet (600 mg total) by mouth every 6 (six) hours. 05/13/19   Morris, Aundra Millet, DO  labetalol (NORMODYNE) 200 MG tablet Take 200 mg by mouth 2 (two) times daily.    [provider]  Prenatal Vit-Fe Fumarate-FA (PRENATAL MULTIVITAMIN) TABS tablet Take 1 tablet by mouth daily at 12 noon.    [provider]    Family History Family History  Problem Relation Age of Onset  . Hypertension Mother   . Diabetes Mother     Social History Social History   Tobacco Use  . Smoking status: Never Smoker  . Smokeless tobacco: Never Used  Substance Use Topics  . Alcohol use: No  . Drug use: No     Allergies   Peanut oil and Shellfish allergy   Review of Systems As per HPI   Physical Exam Triage Vital Signs ED Triage Vitals  Enc Vitals Group     BP      Pulse      Resp  Temp      Temp src      SpO2      Weight      Height      Head Circumference      Peak Flow      Pain Score      Pain Loc      Pain Edu?      Excl. in [place]?    No data found.  Updated Vital Signs BP (!) 143/96 (BP Location: Left Arm)   Pulse (!) 115   Temp 98.7 F (37.1 C) (Oral)   Resp 18   SpO2 97%   Breastfeeding Yes   Visual Acuity Right Eye Distance:   Left Eye Distance:   Bilateral Distance:    Right Eye Near:   Left Eye Near:    Bilateral Near:     Physical Exam Constitutional:      General: She is not in acute distress.    Appearance: She is not ill-appearing.  HENT:     Head: Normocephalic and atraumatic.     Mouth/Throat:     Mouth: Mucous membranes are moist.     Pharynx: Oropharynx is clear.  Eyes:     General: No scleral icterus.    Pupils: Pupils are equal, round, and reactive to light.  Cardiovascular:     Rate and Rhythm: Normal rate and regular rhythm.   Pulmonary:     Effort: Pulmonary effort is normal. No respiratory distress.     Breath sounds: No wheezing.  Chest:     Chest wall: No mass, deformity, swelling, tenderness, crepitus or edema.     Breasts: Tanner Score is 5. Breasts are symmetrical.        Right: Tenderness present. No swelling, bleeding, inverted nipple, mass, nipple discharge or skin change.        Left: Normal.     Comments: Mild tenderness around areola of right breast. Musculoskeletal:     Cervical back: Neck supple. No tenderness.  Lymphadenopathy:     Cervical: No cervical adenopathy.     Upper Body:     Right upper body: No supraclavicular or axillary adenopathy.     Left upper body: No supraclavicular or axillary adenopathy.  Skin:    Coloration: Skin is not jaundiced or pale.  Neurological:     Mental Status: She is alert and oriented to person, place, and time.      UC Treatments / Results  Labs (all labs ordered are listed, but only abnormal results are displayed) Labs Reviewed  NOVEL CORONAVIRUS, NAA    EKG   Radiology No results found.  Procedures Procedures (including critical care time)  Medications Ordered in UC Medications - No data to display  Initial Impression / Assessment and Plan / UC Course  I have reviewed the triage vital signs and the nursing notes.  Pertinent labs & imaging results that were available during my care of the patient were reviewed by me and considered in my medical decision making (see chart for details).     Afebrile, nontoxic in office today.  Exam reassuring: Low concern for infectious status at this time.  Likely second to decreased production and demand of breastmilk.  Reviewed supportive care thereof.  No clear cause of fever: We will treat supportively, monitor closely.  Given that patient is breast-feeding young child, and global pandemic, requesting Covid testing: PCR pending.  Return precautions discussed, patient verbalized understanding and is  agreeable to plan. Final Clinical Impressions(s) /  UC Diagnoses   Final diagnoses:  Breast pain, right  Fever, unspecified fever cause     Discharge Instructions     Your COVID test is pending - it is important to quarantine / isolate at home until your results are back. If you test positive and would like further evaluation for persistent or worsening symptoms, you may schedule an E-visit or virtual (video) visit throughout the [facility] app or website.  PLEASE NOTE: If you develop severe chest pain or shortness of breath please go to the ER or call 9-1-1 for further evaluation --> DO NOT schedule electronic or virtual visits for this. Please call our office for further guidance / recommendations as needed.  For information about the Covid vaccine, please visit [email or web address]    ED Prescriptions    None     PDMP not reviewed this encounter.   Hall-Potvin, [place], [place] 10/24/19 [phone]

## 2019-10-23 NOTE — Discharge Instructions (Signed)
Your COVID test is pending - it is important to quarantine / isolate at home until your results are back. °If you test positive and would like further evaluation for persistent or worsening symptoms, you may schedule an E-visit or virtual (video) visit throughout the [facility] app or website. ° °PLEASE NOTE: If you develop severe chest pain or shortness of breath please go to the ER or call 9-1-1 for further evaluation --> DO NOT schedule electronic or virtual visits for this. °Please call our office for further guidance / recommendations as needed. ° °For information about the Covid vaccine, please visit [email or web address] °

## 2019-10-25 LAB — NOVEL CORONAVIRUS, NAA: SARS-CoV-2, NAA: NOT DETECTED

## 2019-10-25 LAB — SARS-COV-2, NAA 2 DAY TAT
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
## 2020-06-02 ENCOUNTER — Emergency Department (HOSPITAL_BASED_OUTPATIENT_CLINIC_OR_DEPARTMENT_OTHER)
Admission: EM | Admit: 2020-06-02 | Discharge: 2020-06-02 | Disposition: A | Discharge status: Home / Self Care | Payer: Medicaid Other | Attending: Emergency Medicine | Admitting: Emergency Medicine

## 2020-06-02 ENCOUNTER — Emergency Department (HOSPITAL_BASED_OUTPATIENT_CLINIC_OR_DEPARTMENT_OTHER)
Admission: EM | Admit: 2020-06-02 | Discharge: 2020-06-02 | Discharge status: Absent without leave | Payer: Medicaid Other

## 2020-06-02 ENCOUNTER — Other Ambulatory Visit: Payer: Self-pay

## 2020-06-02 DIAGNOSIS — Z5321 Procedure and treatment not carried out due to patient leaving prior to being seen by health care provider: Secondary | ICD-10-CM | POA: Diagnosis not present

## 2020-06-02 DIAGNOSIS — N939 Abnormal uterine and vaginal bleeding, unspecified: Secondary | ICD-10-CM | POA: Insufficient documentation

## 2020-06-02 DIAGNOSIS — R42 Dizziness and giddiness: Secondary | ICD-10-CM | POA: Diagnosis not present

## 2020-06-02 LAB — CBC
HCT: 33.3 % — ABNORMAL LOW (ref 36.0–46.0)
Hemoglobin: 11.1 g/dL — ABNORMAL LOW (ref 12.0–15.0)
MCH: 30.7 pg (ref 26.0–34.0)
MCHC: 33.3 g/dL (ref 30.0–36.0)
MCV: 92.2 fL (ref 80.0–100.0)
Platelets: 220 10*3/uL (ref 150–400)
RBC: 3.61 MIL/uL — ABNORMAL LOW (ref 3.87–5.11)
RDW: 12.4 % (ref 11.5–15.5)
WBC: 9.5 10*3/uL (ref 4.0–10.5)
nRBC: 0 % (ref 0.0–0.2)

## 2020-06-02 LAB — BASIC METABOLIC PANEL
Anion gap: 10 (ref 5–15)
BUN: 13 mg/dL (ref 6–20)
CO2: 24 mmol/L (ref 22–32)
Calcium: 9 mg/dL (ref 8.9–10.3)
Chloride: 101 mmol/L (ref 98–111)
Creatinine, Ser: 0.89 mg/dL (ref 0.44–1.00)
GFR, Estimated: >60 mL/min (ref >60)
Glucose, Bld: 106 mg/dL — ABNORMAL HIGH (ref 70–99)
Potassium: 4.3 mmol/L (ref 3.5–5.1)
Sodium: 135 mmol/L (ref 135–145)

## 2020-06-02 LAB — PREGNANCY, URINE: Preg Test, Ur: NEGATIVE

## 2020-06-02 NOTE — ED Notes (Signed)
Pt sts she needs to leave d/t a child care situation

## 2020-06-02 NOTE — ED Triage Notes (Signed)
Pt had leep procedure 12/2. Has been having vaginal bleeding x 2 days. States now passing large blood clots (about 30 clots). States abdominal cramping with slight dizziness.

## 2020-08-09 ENCOUNTER — Other Ambulatory Visit: Payer: Medicaid Other

## 2020-08-09 DIAGNOSIS — Z20822 Contact with and (suspected) exposure to covid-19: Secondary | ICD-10-CM

## 2020-08-10 LAB — SARS-COV-2, NAA 2 DAY TAT

## 2020-08-10 LAB — NOVEL CORONAVIRUS, NAA: SARS-CoV-2, NAA: DETECTED — AB

## 2020-08-11 ENCOUNTER — Telehealth: Payer: Self-pay | Admitting: Unknown Physician Specialty

## 2020-08-11 NOTE — Telephone Encounter (Signed)
Called to discuss with patient about COVID-19 symptoms and the use of one of the available treatments for those with mild to moderate Covid symptoms and at a high risk of hospitalization.  Pt appears to qualify for outpatient treatment due to co-morbid conditions and/or a member of an at-risk group in accordance with the FDA Emergency Use Authorization.    Unable to reach pt - mailbox is full  Jeweldean Drohan   

## 2021-02-12 ENCOUNTER — Other Ambulatory Visit: Payer: Self-pay | Admitting: Radiology

## 2021-02-12 DIAGNOSIS — N63 Unspecified lump in unspecified breast: Secondary | ICD-10-CM

## 2021-02-18 ENCOUNTER — Other Ambulatory Visit: Payer: Self-pay

## 2021-02-18 ENCOUNTER — Ambulatory Visit
Admission: RE | Admit: 2021-02-18 | Discharge: 2021-02-18 | Disposition: A | Discharge status: Home / Self Care | Payer: Medicaid Other | Source: Ambulatory Visit | Attending: Radiology | Admitting: Radiology

## 2021-02-18 DIAGNOSIS — N63 Unspecified lump in unspecified breast: Secondary | ICD-10-CM

## 2021-08-13 ENCOUNTER — Ambulatory Visit
Admission: EM | Admit: 2021-08-13 | Discharge: 2021-08-13 | Disposition: A | Discharge status: Home / Self Care | Payer: Medicaid Other | Attending: Physician Assistant | Admitting: Physician Assistant

## 2021-08-13 ENCOUNTER — Other Ambulatory Visit: Payer: Self-pay

## 2021-08-13 DIAGNOSIS — M25572 Pain in left ankle and joints of left foot: Secondary | ICD-10-CM

## 2021-08-13 MED ORDER — PREDNISONE 20 MG PO TABS: 40.0000 mg | ORAL_TABLET | Freq: Every day | ORAL | 0 refills | Status: AC

## 2021-08-13 NOTE — ED Triage Notes (Signed)
Onset today of left ankle pain. Pt does not recall what she was doing at the onset. No meds taken. Denies swelling and bruising. No falls or injuries. Notes pain with ambulation and ROM.

## 2021-08-13 NOTE — ED Provider Notes (Signed)
[name]    CSN: [id number] Arrival date & time: 08/13/21  1446      History   Chief Complaint Chief Complaint  Patient presents with   Ankle Pain    left    HPI Kaitlyn Noble is a 31 y.o. female.   Patient here today for evaluation of left ankle pain that started today.  She notes that pain is present to the top portion of her foot and around the anterior portion of her ankle.  She denies any known injury.  She has not had any swelling or bruising.  She has not taken any medication for symptoms.  She states that weightbearing worsens pain.  The history is provided by the patient.   Past Medical History:  Diagnosis Date   Chronic hypertension    Costochondritis    history   Dyspnea    Occasional-history   Headache    Migraines   PCOS (polycystic ovarian syndrome)     Patient Active Problem List   Diagnosis Date Noted   Pregnant 05/11/2019   Candida vaginitis 02/26/2019   Round ligament pain 02/26/2019    Past Surgical History:  Procedure Laterality Date   DILATION AND EVACUATION N/A 12/22/2016   Procedure: DILATATION AND EVACUATION  with ultrasound guidance;  Surgeon: Ranae Pila, MD;  Location: [facility];  Service: Gynecology;  Laterality: N/A;   SHOULDER SURGERY Left    UTERINE SEPTUM RESECTION     WISDOM TOOTH EXTRACTION      OB History     Gravida  2   Para  1   Term  1   Preterm      AB  1   Living  1      SAB  1   IAB      Ectopic      Multiple  0   Live Births  1            Home Medications    Prior to Admission medications   Medication Sig Start Date End Date Taking? Authorizing Provider  predniSONE (DELTASONE) 20 MG tablet Take 2 tablets (40 mg total) by mouth daily with breakfast for 5 days. 08/13/21 08/18/21 Yes Tomi Bamberger, PA-C  cetirizine (ZYRTEC) 10 MG tablet Take 10 mg by mouth daily.    [provider]  Elastic Bandages & Supports (COMFORT FIT MATERNITY SUPP SM) MISC 1  Units by Does not apply route daily as needed. 02/26/19   Raelyn Mora, CNM  fluticasone (FLONASE) 50 MCG/ACT nasal spray Place 1 spray into both nostrils daily. 03/14/19   Hall-Potvin, [place], PA-C  ibuprofen (ADVIL) 600 MG tablet Take 1 tablet (600 mg total) by mouth every 6 (six) hours. 05/13/19   Morris, Aundra Millet, DO  labetalol (NORMODYNE) 200 MG tablet Take 200 mg by mouth 2 (two) times daily.    [provider]  Prenatal Vit-Fe Fumarate-FA (PRENATAL MULTIVITAMIN) TABS tablet Take 1 tablet by mouth daily at 12 noon.    [provider]    Family History Family History  Problem Relation Age of Onset   Hypertension Mother    Diabetes Mother     Social History Social History   Tobacco Use   Smoking status: Never   Smokeless tobacco: Never  Vaping Use   Vaping Use: Never used  Substance Use Topics   Alcohol use: No   Drug use: No     Allergies   Peanut oil and Shellfish allergy   Review of  Systems Review of Systems  Constitutional:  Negative for chills and fever.  Eyes:  Negative for discharge and redness.  Gastrointestinal:  Negative for abdominal pain, nausea and vomiting.  Musculoskeletal:  Positive for arthralgias. Negative for joint swelling.  Skin:  Negative for color change.  Neurological:  Negative for numbness.    Physical Exam Triage Vital Signs ED Triage Vitals  Enc Vitals Group     BP      Pulse      Resp      Temp      Temp src      SpO2      Weight      Height      Head Circumference      Peak Flow      Pain Score      Pain Loc      Pain Edu?      Excl. in [place]?    No data found.  Updated Vital Signs BP 135/80 (BP Location: Left Arm)    Pulse 90    Temp 98.1 F (36.7 C) (Oral)    Resp 18    SpO2 97%    Breastfeeding No     Physical Exam Vitals and nursing note reviewed.  Constitutional:      General: She is not in acute distress.    Appearance: Normal appearance. She is not ill-appearing.  HENT:     Head:  Normocephalic and atraumatic.  Eyes:     Conjunctiva/sclera: Conjunctivae normal.  Cardiovascular:     Rate and Rhythm: Normal rate.  Pulmonary:     Effort: Pulmonary effort is normal.  Musculoskeletal:     Comments: Full range of motion of left ankle with pain noted at extremes.  No swelling apparent.  No erythema noted.  Skin:    Capillary Refill: Normal coloration of left toes. Neurological:     Mental Status: She is alert.  Psychiatric:        Mood and Affect: Mood normal.        Behavior: Behavior normal.        Thought Content: Thought content normal.     UC Treatments / Results  Labs (all labs ordered are listed, but only abnormal results are displayed) Labs Reviewed - No data to display  EKG   Radiology No results found.  Procedures Procedures (including critical care time)  Medications Ordered in UC Medications - No data to display  Initial Impression / Assessment and Plan / UC Course  I have reviewed the triage vital signs and the nursing notes.  Pertinent labs & imaging results that were available during my care of the patient were reviewed by me and considered in my medical decision making (see chart for details).    Unknown etiology of ankle pain but low suspicion for fracture or significant strain or sprain given no injury.  Will treat with short course of steroids in hopes to decrease inflammation and improve symptoms.  Encouraged follow-up if symptoms fail to improve or worsen.  Final Clinical Impressions(s) / UC Diagnoses   Final diagnoses:  Acute left ankle pain   Discharge Instructions   None    ED Prescriptions     Medication Sig Dispense Auth. Provider   predniSONE (DELTASONE) 20 MG tablet Take 2 tablets (40 mg total) by mouth daily with breakfast for 5 days. 10 tablet Tomi Bamberger, PA-C      PDMP not reviewed this encounter.   Tomi Bamberger, PA-C 08/13/21  1625 ° °

## 2021-12-29 ENCOUNTER — Encounter (HOSPITAL_COMMUNITY): Payer: Self-pay | Admitting: Obstetrics and Gynecology

## 2021-12-29 ENCOUNTER — Other Ambulatory Visit: Payer: Self-pay | Admitting: Obstetrics and Gynecology

## 2021-12-29 ENCOUNTER — Other Ambulatory Visit (HOSPITAL_COMMUNITY): Payer: Self-pay | Admitting: Obstetrics and Gynecology

## 2021-12-29 DIAGNOSIS — O021 Missed abortion: Secondary | ICD-10-CM

## 2021-12-29 NOTE — H&P (Signed)
Kaitlyn Noble is an 31 y.o. female. Presenting with a missed AB. Previous + FHT measuring 7.1 wga. Now 8.1 wga without FHT. She has a hx uterine septum resection as well as a D&E at 10 wga. She had a successful pregnancy and SVD in 2020. She is being followed in the office for abnormal pap smears.  She has PCOS in additionto well controlled HTN.   Pertinent Gynecological History: OB History: G3, P1011     Past Medical History:  Diagnosis Date   Chronic hypertension    Costochondritis    history   Dyspnea    Occasional-history   Headache    Migraines   PCOS (polycystic ovarian syndrome)     Past Surgical History:  Procedure Laterality Date   DILATION AND EVACUATION N/A 12/22/2016   Procedure: DILATATION AND EVACUATION  with ultrasound guidance;  Surgeon: Ranae Pila, MD;  Location: [facility];  Service: Gynecology;  Laterality: N/A;   SHOULDER SURGERY Left    UTERINE SEPTUM RESECTION     WISDOM TOOTH EXTRACTION      Family History  Problem Relation Age of Onset   Hypertension Mother    Diabetes Mother     Social History:  reports that she has never smoked. She has never used smokeless tobacco. She reports that she does not drink alcohol and does not use drugs.  Allergies:  Allergies  Allergen Reactions   Peanut Oil Hives    Patient states can tolerate items cooked in the oil   Shellfish Allergy     unknown Other reaction(s): Other unknown    No medications prior to admission.    Review of Systems  There were no vitals taken for this visit. Physical Exam Gen: well appearing, NAD CV: Reg rate Pulm: NWOB Abd: soft, nondistended, nontender, no masses GYN: uterus 9 week size, no adnexa ttp/CMT Ext: No edema b/l  No results found for this or any previous visit (from the past 24 hour(s)).  No results found.  Assessment/Plan: Pt counseled on TVUS results and diagnosis of MAB. Discussed that it is unlikely to be her fault nor could she have prevented  it. Reviewed that this miscarriage does not likely reflect her ability to have a successful pregnancy in the future, and that miscarriage is common - 1:5 pregnancies. She was counseled on options for managing missed ab including expectant, medical, and surgical management.  Patient desires definitive surgical management with suction dilation and curettage.   Risks/benefits/ and alternatives reviewed with patient with risks including but not limited to bleeding, infection, uterine perforation, and damage to nearby structures such as the bowel, bladder, vessels, and/or other organs. She was given opportunity to ask questions and all questions answered. Patient consents to proceed with suction dilation and curettage. Reviewed bleeding precautions. Her blood type is O+ and she does not need Rhogam. Given instructions and she verbalizes understanding. Discussed the importance of having at least one normal periods after completion of the process, before attempting conception again. Discussed S/S to call back for. All questions answered and pt verbalizes understanding w/out further questions/concerns. Risks discussed including infection, bleeding, damage to surrounding structures, need for additional procedures, postoperative DVT and subsequent scarring. All questions answered. Consent signed in office.  Plan on doxycycline postop and consider cytotec x 3 days.    Ranae Pila 12/29/2021, 4:30 PM

## 2021-12-29 NOTE — Progress Notes (Signed)
PCP - Laurann Montana, MD Cardiologist - pt denies EKG - DOS Chest x-ray -  ECHO -  Cardiac Cath -  CPAP -   ERAS Protcol - n/a - clears 1000 COVID TEST- n/a  Anesthesia review: n/a  -------------  SDW INSTRUCTIONS:  Your procedure is scheduled on 7/11 . Please report to [facility] Main Entrance "A" at 1030 A.M., and check in at the Admitting office. Call this number if you have problems the morning of surgery: [phone]   Remember: Do not eat after midnight the night before your surgery  You may drink clear liquids until 10:00 AM the morning of your surgery.   Clear liquids allowed are: Water, Non-Citrus Juices (without pulp), Carbonated Beverages, Clear Tea, Black Coffee Only, and Gatorade   Medications to take morning of surgery with a sip of water include: labetalol (NORMODYNE)   As of today, STOP taking any Aspirin (unless otherwise instructed by your surgeon), Aleve, Naproxen, Ibuprofen, Motrin, Advil, Goody's, BC's, all herbal medications, fish oil, and all vitamins.    The Morning of Surgery Do not wear jewelry, make-up or nail polish. Do not wear lotions, powders, or perfumes, or deodorant Do not bring valuables to the hospital. [facility] is not responsible for any belongings or valuables.  If you are a smoker, DO NOT Smoke 24 hours prior to surgery  If you wear a CPAP at night please bring your mask the morning of surgery   Remember that you must have someone to transport you home after your surgery, and remain with you for 24 hours if you are discharged the same day.  Please bring cases for contacts, glasses, hearing aids, dentures or bridgework because it cannot be worn into surgery.   Patients discharged the day of surgery will not be allowed to drive home.   Please shower the NIGHT BEFORE/MORNING OF SURGERY (use antibacterial soap like DIAL soap if possible). Wear comfortable clothes the morning of surgery. Oral Hygiene is also important to reduce your  risk of infection.  Remember - BRUSH YOUR TEETH THE MORNING OF SURGERY WITH YOUR REGULAR TOOTHPASTE  Patient denies shortness of breath, fever, cough and chest pain.

## 2021-12-30 ENCOUNTER — Ambulatory Visit (HOSPITAL_COMMUNITY)
Admission: RE | Admit: 2021-12-30 | Discharge: 2021-12-30 | Disposition: A | Discharge status: Home / Self Care | Payer: Medicaid Other | Source: Ambulatory Visit | Attending: Obstetrics and Gynecology | Admitting: Obstetrics and Gynecology

## 2021-12-30 ENCOUNTER — Encounter (HOSPITAL_COMMUNITY): Payer: Self-pay | Admitting: Obstetrics and Gynecology

## 2021-12-30 ENCOUNTER — Ambulatory Visit (HOSPITAL_COMMUNITY)
Admission: RE | Admit: 2021-12-30 | Discharge: 2021-12-30 | Disposition: A | Discharge status: Home / Self Care | Payer: Medicaid Other | Attending: Obstetrics and Gynecology | Admitting: Obstetrics and Gynecology

## 2021-12-30 ENCOUNTER — Encounter (HOSPITAL_COMMUNITY)
Admission: RE | Disposition: A | Discharge status: Home / Self Care | Payer: Self-pay | Source: Home / Self Care | Attending: Obstetrics and Gynecology

## 2021-12-30 ENCOUNTER — Ambulatory Visit (HOSPITAL_BASED_OUTPATIENT_CLINIC_OR_DEPARTMENT_OTHER): Payer: Medicaid Other | Admitting: Anesthesiology

## 2021-12-30 ENCOUNTER — Ambulatory Visit (HOSPITAL_COMMUNITY): Payer: Medicaid Other | Admitting: Anesthesiology

## 2021-12-30 ENCOUNTER — Other Ambulatory Visit: Payer: Self-pay

## 2021-12-30 DIAGNOSIS — E282 Polycystic ovarian syndrome: Secondary | ICD-10-CM | POA: Diagnosis not present

## 2021-12-30 DIAGNOSIS — O021 Missed abortion: Secondary | ICD-10-CM | POA: Diagnosis present

## 2021-12-30 DIAGNOSIS — I10 Essential (primary) hypertension: Secondary | ICD-10-CM | POA: Insufficient documentation

## 2021-12-30 DIAGNOSIS — O039 Complete or unspecified spontaneous abortion without complication: Secondary | ICD-10-CM

## 2021-12-30 HISTORY — PX: OPERATIVE ULTRASOUND: SHX5996

## 2021-12-30 HISTORY — PX: DILATION AND EVACUATION: SHX1459

## 2021-12-30 LAB — BASIC METABOLIC PANEL
Anion gap: 7 (ref 5–15)
BUN: 8 mg/dL (ref 6–20)
CO2: 22 mmol/L (ref 22–32)
Calcium: 9.2 mg/dL (ref 8.9–10.3)
Chloride: 106 mmol/L (ref 98–111)
Creatinine, Ser: 0.83 mg/dL (ref 0.44–1.00)
GFR, Estimated: >60 mL/min (ref >60)
Glucose, Bld: 91 mg/dL (ref 70–99)
Potassium: 3.9 mmol/L (ref 3.5–5.1)
Sodium: 135 mmol/L (ref 135–145)

## 2021-12-30 LAB — CBC
HCT: 38.9 % (ref 36.0–46.0)
Hemoglobin: 13.4 g/dL (ref 12.0–15.0)
MCH: 31.7 pg (ref 26.0–34.0)
MCHC: 34.4 g/dL (ref 30.0–36.0)
MCV: 92 fL (ref 80.0–100.0)
Platelets: 195 10*3/uL (ref 150–400)
RBC: 4.23 MIL/uL (ref 3.87–5.11)
RDW: 12.6 % (ref 11.5–15.5)
WBC: 6.4 10*3/uL (ref 4.0–10.5)
nRBC: 0 % (ref 0.0–0.2)

## 2021-12-30 LAB — TYPE AND SCREEN
ABO/RH(D): O POS
Antibody Screen: NEGATIVE

## 2021-12-30 SURGERY — DILATION AND EVACUATION, UTERUS
Anesthesia: General

## 2021-12-30 MED ORDER — FENTANYL CITRATE (PF) 250 MCG/5ML IJ SOLN
INTRAMUSCULAR | Status: DC | PRN
  Administered 2021-12-30 (×2): 25 ug via INTRAVENOUS

## 2021-12-30 MED ORDER — PHENYLEPHRINE 80 MCG/ML (10ML) SYRINGE FOR IV PUSH (FOR BLOOD PRESSURE SUPPORT)
PREFILLED_SYRINGE | INTRAVENOUS | Status: DC | PRN
  Administered 2021-12-30: 160 ug via INTRAVENOUS

## 2021-12-30 MED ORDER — SCOPOLAMINE 1 MG/3DAYS TD PT72
1.0000 | MEDICATED_PATCH | TRANSDERMAL | Status: DC
  Administered 2021-12-30: 1.5 mg via TRANSDERMAL
  Filled 2021-12-30: qty 1

## 2021-12-30 MED ORDER — MUPIROCIN 2 % EX OINT
TOPICAL_OINTMENT | CUTANEOUS | Status: AC
  Administered 2021-12-30: 1 via TOPICAL
  Filled 2021-12-30: qty 22

## 2021-12-30 MED ORDER — PHENYLEPHRINE 80 MCG/ML (10ML) SYRINGE FOR IV PUSH (FOR BLOOD PRESSURE SUPPORT)
PREFILLED_SYRINGE | INTRAVENOUS | Status: AC
  Filled 2021-12-30: qty 10

## 2021-12-30 MED ORDER — MIDAZOLAM HCL 2 MG/2ML IJ SOLN
INTRAMUSCULAR | Status: DC | PRN
  Administered 2021-12-30: 2 mg via INTRAVENOUS

## 2021-12-30 MED ORDER — CHLORHEXIDINE GLUCONATE 0.12 % MT SOLN
15.0000 mL | Freq: Once | OROMUCOSAL | Status: AC
  Administered 2021-12-30: 15 mL via OROMUCOSAL
  Filled 2021-12-30: qty 15

## 2021-12-30 MED ORDER — LIDOCAINE 2% (20 MG/ML) 5 ML SYRINGE
INTRAMUSCULAR | Status: DC | PRN
  Administered 2021-12-30: 60 mg via INTRAVENOUS

## 2021-12-30 MED ORDER — KETOROLAC TROMETHAMINE 30 MG/ML IJ SOLN
INTRAMUSCULAR | Status: DC | PRN
  Administered 2021-12-30: 30 mg via INTRAVENOUS

## 2021-12-30 MED ORDER — PROPOFOL 10 MG/ML IV BOLUS
INTRAVENOUS | Status: DC | PRN
  Administered 2021-12-30: 200 mg via INTRAVENOUS

## 2021-12-30 MED ORDER — OXYCODONE HCL 5 MG PO TABS: 5.0000 mg | ORAL_TABLET | Freq: Once | ORAL | Status: DC | PRN

## 2021-12-30 MED ORDER — MIDAZOLAM HCL 2 MG/2ML IJ SOLN
INTRAMUSCULAR | Status: AC
  Filled 2021-12-30: qty 2

## 2021-12-30 MED ORDER — FENTANYL CITRATE (PF) 250 MCG/5ML IJ SOLN
INTRAMUSCULAR | Status: AC
  Filled 2021-12-30: qty 5

## 2021-12-30 MED ORDER — FENTANYL CITRATE (PF) 100 MCG/2ML IJ SOLN: 25.0000 ug | INTRAMUSCULAR | Status: DC | PRN

## 2021-12-30 MED ORDER — ONDANSETRON HCL 4 MG/2ML IJ SOLN
INTRAMUSCULAR | Status: AC
  Filled 2021-12-30: qty 2

## 2021-12-30 MED ORDER — DEXAMETHASONE SODIUM PHOSPHATE 10 MG/ML IJ SOLN
INTRAMUSCULAR | Status: AC
  Filled 2021-12-30: qty 1

## 2021-12-30 MED ORDER — LIDOCAINE 2% (20 MG/ML) 5 ML SYRINGE
INTRAMUSCULAR | Status: AC
  Filled 2021-12-30: qty 5

## 2021-12-30 MED ORDER — 0.9 % SODIUM CHLORIDE (POUR BTL) OPTIME
TOPICAL | Status: DC | PRN
  Administered 2021-12-30: 1000 mL

## 2021-12-30 MED ORDER — ACETAMINOPHEN 500 MG PO TABS
1000.0000 mg | ORAL_TABLET | ORAL | Status: AC
  Administered 2021-12-30: 1000 mg via ORAL
  Filled 2021-12-30: qty 2

## 2021-12-30 MED ORDER — PROPOFOL 10 MG/ML IV BOLUS
INTRAVENOUS | Status: AC
  Filled 2021-12-30: qty 20

## 2021-12-30 MED ORDER — ONDANSETRON HCL 4 MG/2ML IJ SOLN
INTRAMUSCULAR | Status: DC | PRN
  Administered 2021-12-30: 4 mg via INTRAVENOUS

## 2021-12-30 MED ORDER — MIDAZOLAM HCL 2 MG/2ML IJ SOLN: 0.5000 mg | Freq: Once | INTRAMUSCULAR | Status: DC | PRN

## 2021-12-30 MED ORDER — OXYCODONE HCL 5 MG/5ML PO SOLN: 5.0000 mg | Freq: Once | ORAL | Status: DC | PRN

## 2021-12-30 MED ORDER — BUPIVACAINE HCL (PF) 0.25 % IJ SOLN
INTRAMUSCULAR | Status: AC
  Filled 2021-12-30: qty 30

## 2021-12-30 MED ORDER — PROMETHAZINE HCL 25 MG/ML IJ SOLN: 6.2500 mg | INTRAMUSCULAR | Status: DC | PRN

## 2021-12-30 MED ORDER — LACTATED RINGERS IV SOLN: INTRAVENOUS | Status: DC

## 2021-12-30 MED ORDER — ORAL CARE MOUTH RINSE: 15.0000 mL | Freq: Once | OROMUCOSAL | Status: AC

## 2021-12-30 MED ORDER — POVIDONE-IODINE 10 % EX SWAB: 2.0000 | Freq: Once | CUTANEOUS | Status: DC

## 2021-12-30 MED ORDER — DEXAMETHASONE SODIUM PHOSPHATE 10 MG/ML IJ SOLN
INTRAMUSCULAR | Status: DC | PRN
  Administered 2021-12-30: 10 mg via INTRAVENOUS

## 2021-12-30 MED ORDER — MEPERIDINE HCL 25 MG/ML IJ SOLN: 6.2500 mg | INTRAMUSCULAR | Status: DC | PRN

## 2021-12-30 MED ORDER — BUPIVACAINE HCL (PF) 0.25 % IJ SOLN
INTRAMUSCULAR | Status: DC | PRN
  Administered 2021-12-30: 6 mL

## 2021-12-30 MED ORDER — MUPIROCIN 2 % EX OINT: 1.0000 | TOPICAL_OINTMENT | Freq: Once | CUTANEOUS | Status: AC

## 2021-12-30 SURGICAL SUPPLY — 23 items
CATH ROBINSON RED A/P 16FR (CATHETERS) ×3 IMPLANT
DRSG TELFA 3X8 NADH (GAUZE/BANDAGES/DRESSINGS) ×2 IMPLANT
FILTER UTR ASPR ASSEMBLY (MISCELLANEOUS) ×3 IMPLANT
GLOVE BIO SURGEON STRL SZ 6.5 (GLOVE) ×3 IMPLANT
GLOVE BIOGEL PI IND STRL 7.0 (GLOVE) ×4 IMPLANT
GLOVE BIOGEL PI INDICATOR 7.0 (GLOVE) ×2
GOWN STRL REUS W/ TWL LRG LVL3 (GOWN DISPOSABLE) ×4 IMPLANT
GOWN STRL REUS W/TWL LRG LVL3 (GOWN DISPOSABLE) ×4
HOSE CONNECTING 18IN BERKELEY (TUBING) ×3 IMPLANT
KIT BERKELEY 1ST TRI 3/8 NO TR (MISCELLANEOUS) ×3 IMPLANT
KIT BERKELEY 1ST TRIMESTER 3/8 (MISCELLANEOUS) ×3 IMPLANT
NS IRRIG 1000ML POUR BTL (IV SOLUTION) ×3 IMPLANT
PACK VAGINAL MINOR WOMEN LF (CUSTOM PROCEDURE TRAY) ×3 IMPLANT
PAD DRESSING TELFA 3X8 NADH (GAUZE/BANDAGES/DRESSINGS) ×2 IMPLANT
PAD OB MATERNITY 4.3X12.25 (PERSONAL CARE ITEMS) ×3 IMPLANT
SET BERKELEY SUCTION TUBING (SUCTIONS) ×3 IMPLANT
SPIKE FLUID TRANSFER (MISCELLANEOUS) ×3 IMPLANT
TOWEL GREEN STERILE FF (TOWEL DISPOSABLE) ×6 IMPLANT
UNDERPAD 30X36 HEAVY ABSORB (UNDERPADS AND DIAPERS) ×3 IMPLANT
VACURETTE 10 RIGID CVD (CANNULA) IMPLANT
VACURETTE 7MM CVD STRL WRAP (CANNULA) IMPLANT
VACURETTE 8 RIGID CVD (CANNULA) IMPLANT
VACURETTE 9 RIGID CVD (CANNULA) ×1 IMPLANT

## 2021-12-30 NOTE — Anesthesia Procedure Notes (Signed)
Procedure Name: LMA Insertion Date/Time: 12/30/2021 1:13 PM  Performed by: Cy Blamer, CRNAPre-anesthesia Checklist: Patient identified, Emergency Drugs available, Suction available, Patient being monitored and Timeout performed Patient Re-evaluated:Patient Re-evaluated prior to induction Oxygen Delivery Method: Circle system utilized Preoxygenation: Pre-oxygenation with 100% oxygen Induction Type: IV induction LMA: LMA inserted LMA Size: 4.0 Number of attempts: 1 Tube secured with: Tape Dental Injury: Teeth and Oropharynx as per pre-operative assessment

## 2021-12-30 NOTE — H&P (Signed)
Kaitlyn Noble is an 31 y.o. female. Presenting with a missed AB. Previous + FHT measuring 7.1 wga. Now 8.1 wga without FHT. She has a hx uterine septum resection as well as a D&E at 10 wga. She had a successful pregnancy and SVD in 2020. She is being followed in the office for abnormal pap smears.  She has PCOS in additionto well controlled HTN.    Pertinent Gynecological History: OB History: G3, P1011                  Past Medical History:  Diagnosis Date   Chronic hypertension     Costochondritis      history   Dyspnea      Occasional-history   Headache      Migraines   PCOS (polycystic ovarian syndrome)             Past Surgical History:  Procedure Laterality Date   DILATION AND EVACUATION N/A 12/22/2016    Procedure: DILATATION AND EVACUATION  with ultrasound guidance;  Surgeon: Ranae Pila, MD;  Location: [facility];  Service: Gynecology;  Laterality: N/A;   SHOULDER SURGERY Left     UTERINE SEPTUM RESECTION       WISDOM TOOTH EXTRACTION               Family History  Problem Relation Age of Onset   Hypertension Mother     Diabetes Mother        Social History:  reports that she has never smoked. She has never used smokeless tobacco. She reports that she does not drink alcohol and does not use drugs.   Allergies:       Allergies  Allergen Reactions   Peanut Oil Hives      Patient states can tolerate items cooked in the oil   Shellfish Allergy        unknown Other reaction(s): Other unknown      No medications prior to admission.      Review of Systems   There were no vitals taken for this visit. Physical Exam Gen: well appearing, NAD CV: Reg rate Pulm: NWOB Abd: soft, nondistended, nontender, no masses GYN: uterus 9 week size, no adnexa ttp/CMT Ext: No edema b/l   Lab Results Last 24 Hours  No results found for this or any previous visit (from the past 24 hour(s)).     Imaging Results (Last 48 hours)  No results found.      Assessment/Plan: Pt counseled on TVUS results and diagnosis of MAB. Discussed that it is unlikely to be her fault nor could she have prevented it. Reviewed that this miscarriage does not likely reflect her ability to have a successful pregnancy in the future, and that miscarriage is common - 1:5 pregnancies. She was counseled on options for managing missed ab including expectant, medical, and surgical management.  Patient desires definitive surgical management with suction dilation and curettage.    Risks/benefits/ and alternatives reviewed with patient with risks including but not limited to bleeding, infection, uterine perforation, and damage to nearby structures such as the bowel, bladder, vessels, and/or other organs. She was given opportunity to ask questions and all questions answered. Patient consents to proceed with suction dilation and curettage. Reviewed bleeding precautions. Her blood type is O+ and she does not need Rhogam. Given instructions and she verbalizes understanding. Discussed the importance of having at least one normal periods after completion of the process, before attempting conception again. Discussed S/S to call back  for. All questions answered and pt verbalizes understanding w/out further questions/concerns. Risks discussed including infection, bleeding, damage to surrounding structures, need for additional procedures, postoperative DVT and subsequent scarring. All questions answered. Consent signed in office.  Plan on doxycycline postop and consider cytotec x 3 days.

## 2021-12-30 NOTE — Progress Notes (Signed)
No updates to above H&P. Patient arrived NPO and was consented in PACU. Risks again discussed, all questions answered, and consent signed. Proceed with above surgery.    Gifford Ballon MD  

## 2021-12-30 NOTE — Transfer of Care (Signed)
Immediate Anesthesia Transfer of Care Note  Patient: Kaitlyn Noble  Procedure(s) Performed: DILATATION AND EVACUATION OPERATIVE ULTRASOUND  Patient Location: PACU  Anesthesia Type:General  Level of Consciousness: awake, alert  and oriented  Airway & Oxygen Therapy: Patient Spontanous Breathing and Patient connected to nasal cannula oxygen  Post-op Assessment: Report given to RN, Post -op Vital signs reviewed and stable, Patient moving all extremities X 4 and Patient able to stick tongue midline  Post vital signs: Reviewed  Last Vitals:  Vitals Value Taken Time  BP 135/93 12/30/21 1345  Temp 36.8   Pulse 104 12/30/21 1346  Resp 18   SpO2 100 % 12/30/21 1346  Vitals shown include unvalidated device data.  Last Pain:  Vitals:   12/30/21 1056  TempSrc:   PainSc: 0-No pain         Complications: No notable events documented.

## 2021-12-30 NOTE — Anesthesia Preprocedure Evaluation (Addendum)
Anesthesia Evaluation  Patient identified by MRN, date of birth, ID band Patient awake    Reviewed: Allergy & Precautions, NPO status , Patient's Chart, lab work & pertinent test results  History of Anesthesia Complications Negative for: history of anesthetic complications  Airway Mallampati: II  TM Distance: >3 FB Neck ROM: Full    Dental  (+) Teeth Intact, Dental Advisory Given   Pulmonary neg pulmonary ROS,    breath sounds clear to auscultation       Cardiovascular hypertension, Pt. on medications (-) angina Rhythm:Regular Rate:Normal     Neuro/Psych  Headaches,    GI/Hepatic negative GI ROS, Neg liver ROS,   Endo/Other  negative endocrine ROSPCOS  Renal/GU negative Renal ROS     Musculoskeletal   Abdominal   Peds  Hematology negative hematology ROS (+)   Anesthesia Other Findings   Reproductive/Obstetrics (+) Pregnancy (missed Ab)                            Anesthesia Physical Anesthesia Plan  ASA: 2  Anesthesia Plan: General   Post-op Pain Management: Tylenol PO (pre-op)*   Induction: Intravenous  PONV Risk Score and Plan: 3 and Ondansetron, Dexamethasone and Scopolamine patch - Pre-op  Airway Management Planned: LMA  Additional Equipment: None  Intra-op Plan:   Post-operative Plan:   Informed Consent: I have reviewed the patients History and Physical, chart, labs and discussed the procedure including the risks, benefits and alternatives for the proposed anesthesia with the patient or authorized representative who has indicated his/her understanding and acceptance.     Dental advisory given  Plan Discussed with: CRNA and Surgeon  Anesthesia Plan Comments:        Anesthesia Quick Evaluation

## 2021-12-30 NOTE — Op Note (Signed)
PREOPERATIVE DIAGNOSES: 1. Missed Abortion in 1st trimester  POSTOPERATIVE DIAGNOSES: Same  PROCEDURE PERFORMED: Dilation, suction, sharp curretage  SURGEON: Dr. Belva Agee  ANESTHESIA: Paracervical block and IV sedation  ESTIMATED BLOOD LOSS: 25 cc.  COMPLICATIONS: None  TUBES: None.  DRAINS: None  PATHOLOGY: Endometrial curettings  FINDINGS: On exam, under anesthesia, normal appearing vulva and vagina, 9 week sized uterus  Operative findings demonstrated plethora of POCs  Procedure: The patient was taken to the operating room where she was properly prepped and draped in sterile manner under general anesthesia. After bimanual examination, the cervix was exposed with a speculum and the anterior lip of the cervix grasped with a tenaculum. Paracervical block performed. The endocervical canal was then progressively dilated to 9 mm under US guidance. Suction catheter was introduced into the uterus and to the uterine fundus under [place] giudance. The uterus was evacuated and good tissue return was noted. [place] confirmed completed procedure without evidence of rPOCs. A sharp curettage was then performed until gritty texture noted. All instruments were removed from vagina. The sponge and lap counts were correct times 2 at this time. The patient's procedure was terminated. We then awakened her. She was sent to the Recovery Room in good condition.    Belva Agee MD

## 2021-12-30 NOTE — Anesthesia Postprocedure Evaluation (Signed)
Anesthesia Post Note  Patient: Museum/gallery conservator  Procedure(s) Performed: DILATATION AND EVACUATION OPERATIVE ULTRASOUND     Patient location during evaluation: PACU Anesthesia Type: General Level of consciousness: awake and alert, patient cooperative and oriented Pain management: pain level controlled Vital Signs Assessment: post-procedure vital signs reviewed and stable Respiratory status: spontaneous breathing, nonlabored ventilation and respiratory function stable Cardiovascular status: blood pressure returned to baseline and stable Postop Assessment: no apparent nausea or vomiting, able to ambulate and adequate PO intake Anesthetic complications: no   No notable events documented.  Last Vitals:  Vitals:   12/30/21 1415 12/30/21 1430  BP: 129/85 127/79  Pulse: 80 75  Resp: 16 20  Temp:    SpO2: 98% 99%    Last Pain:  Vitals:   12/30/21 1430  TempSrc:   PainSc: 0-No pain                 Ashara Lounsbury,E. Thayne Cindric

## 2021-12-31 ENCOUNTER — Encounter (HOSPITAL_COMMUNITY): Payer: Self-pay | Admitting: Obstetrics and Gynecology

## 2021-12-31 LAB — SURGICAL PATHOLOGY

## 2022-09-09 ENCOUNTER — Other Ambulatory Visit (HOSPITAL_COMMUNITY): Payer: Self-pay | Admitting: Obstetrics and Gynecology

## 2022-09-09 DIAGNOSIS — O039 Complete or unspecified spontaneous abortion without complication: Secondary | ICD-10-CM

## 2022-09-10 ENCOUNTER — Other Ambulatory Visit: Payer: Self-pay

## 2022-09-10 ENCOUNTER — Encounter (HOSPITAL_COMMUNITY): Payer: Self-pay | Admitting: Obstetrics and Gynecology

## 2022-09-10 NOTE — H&P (Signed)
Kaitlyn Noble is an 32 y.o. female. Presenting for surgical management of MAB. By dates she is 10 weeks but baby measured 6.4 wga. She has a hx uterine septum resection as well as a D&E at 10 wga and another at [facility]. She had a successful pregnancy and SVD in 2020. She is being followed in the office for abnormal pap smears.  She has PCOS in additionto well controlled HTN.    Pertinent Gynecological History: OB History: G3, P1011      Past Medical History:  Diagnosis Date   Chronic hypertension    Costochondritis    history   Dyspnea    Occasional-history   Headache    Migraines   PCOS (polycystic ovarian syndrome)     Past Surgical History:  Procedure Laterality Date   DILATION AND EVACUATION N/A 12/22/2016   Procedure: DILATATION AND EVACUATION  with ultrasound guidance;  Surgeon: Tyson Dense, MD;  Location: [facility];  Service: Gynecology;  Laterality: N/A;   DILATION AND EVACUATION N/A 12/30/2021   Procedure: DILATATION AND EVACUATION;  Surgeon: Tyson Dense, MD;  Location: [facility];  Service: Gynecology;  Laterality: N/A;   OPERATIVE ULTRASOUND N/A 12/30/2021   Procedure: OPERATIVE ULTRASOUND;  Surgeon: Tyson Dense, MD;  Location: [facility];  Service: Gynecology;  Laterality: N/A;   SHOULDER SURGERY Left    UTERINE SEPTUM RESECTION     WISDOM TOOTH EXTRACTION      Family History  Problem Relation Age of Onset   Hypertension Mother    Diabetes Mother     Social History:  reports that she has never smoked. She has never used smokeless tobacco. She reports that she does not drink alcohol and does not use drugs.  Allergies:  Allergies  Allergen Reactions   Peanut Oil Hives    Patient states can tolerate items cooked in the oil   Shellfish Allergy     unknown Other reaction(s): Other unknown    No medications prior to admission.    Review of Systems  There were no vitals taken for this visit. Physical Exam Gen: well appearing, NAD CV: Reg  rate Pulm: NWOB Abd: soft, nondistended, nontender, no masses GYN: uterus 7 week size, no adnexa ttp/CMT Ext: No edema b/l   No results found for this or any previous visit (from the past 24 hour(s)).  No results found.  Assessment/Plan: Pt counseled on TVUS results and diagnosis of MAB. Discussed that it is unlikely to be her fault nor could she have prevented it. Reviewed that this miscarriage does not likely reflect her ability to have a successful pregnancy in the future, and that miscarriage is common - 1:5 pregnancies. She was counseled on options for managing missed ab including expectant, medical, and surgical management.  Patient desires definitive surgical management with suction dilation and curettage. Will do under TVUS guidance and do anora testing.    Risks/benefits/ and alternatives reviewed with patient with risks including but not limited to bleeding, infection, uterine perforation, and damage to nearby structures such as the bowel, bladder, vessels, and/or other organs. She was given opportunity to ask questions and all questions answered. Patient consents to proceed with suction dilation and curettage. Reviewed bleeding precautions. Her blood type is O+ and she does not need Rhogam. Given instructions and she verbalizes understanding. Discussed the importance of having at least one normal periods after completion of the process, before attempting conception again. Discussed S/S to call back for. All questions answered and pt verbalizes understanding  w/out further questions/concerns. Risks discussed including infection, bleeding, damage to surrounding structures, need for additional procedures, postoperative DVT and subsequent scarring. All questions answered. Consent signed in office.  Plan on doxycycline postop and consider cytotec x 3 days.   Tyson Dense 09/10/2022, 3:26 PM

## 2022-09-10 NOTE — Progress Notes (Signed)
Spoke with pt for pre-op call. Pt is treated for HTN but denies cardiac history or Diabetes.   Shower instructions given to pt.

## 2022-09-11 ENCOUNTER — Ambulatory Visit (HOSPITAL_COMMUNITY)
Admission: RE | Admit: 2022-09-11 | Discharge: 2022-09-11 | Disposition: A | Discharge status: Home / Self Care | Payer: Medicaid Other | Source: Ambulatory Visit | Attending: Obstetrics and Gynecology | Admitting: Obstetrics and Gynecology

## 2022-09-11 ENCOUNTER — Encounter (HOSPITAL_COMMUNITY)
Admission: RE | Disposition: A | Discharge status: Home / Self Care | Payer: Self-pay | Source: Ambulatory Visit | Attending: Obstetrics and Gynecology

## 2022-09-11 ENCOUNTER — Encounter (HOSPITAL_COMMUNITY): Payer: Self-pay | Admitting: Obstetrics and Gynecology

## 2022-09-11 ENCOUNTER — Ambulatory Visit (HOSPITAL_BASED_OUTPATIENT_CLINIC_OR_DEPARTMENT_OTHER): Payer: Medicaid Other | Admitting: Anesthesiology

## 2022-09-11 ENCOUNTER — Ambulatory Visit (HOSPITAL_COMMUNITY): Payer: Medicaid Other | Admitting: Anesthesiology

## 2022-09-11 DIAGNOSIS — O10911 Unspecified pre-existing hypertension complicating pregnancy, first trimester: Secondary | ICD-10-CM | POA: Insufficient documentation

## 2022-09-11 DIAGNOSIS — Z79899 Other long term (current) drug therapy: Secondary | ICD-10-CM | POA: Insufficient documentation

## 2022-09-11 DIAGNOSIS — O99281 Endocrine, nutritional and metabolic diseases complicating pregnancy, first trimester: Secondary | ICD-10-CM | POA: Insufficient documentation

## 2022-09-11 DIAGNOSIS — Z3A10 10 weeks gestation of pregnancy: Secondary | ICD-10-CM | POA: Diagnosis not present

## 2022-09-11 DIAGNOSIS — E282 Polycystic ovarian syndrome: Secondary | ICD-10-CM | POA: Diagnosis not present

## 2022-09-11 DIAGNOSIS — O039 Complete or unspecified spontaneous abortion without complication: Secondary | ICD-10-CM

## 2022-09-11 DIAGNOSIS — O021 Missed abortion: Secondary | ICD-10-CM

## 2022-09-11 DIAGNOSIS — N96 Recurrent pregnancy loss: Secondary | ICD-10-CM

## 2022-09-11 HISTORY — PX: DILATION AND EVACUATION: SHX1459

## 2022-09-11 HISTORY — PX: OPERATIVE ULTRASOUND: SHX5996

## 2022-09-11 LAB — CBC
HCT: 39.5 % (ref 36.0–46.0)
Hemoglobin: 13.5 g/dL (ref 12.0–15.0)
MCH: 31.7 pg (ref 26.0–34.0)
MCHC: 34.2 g/dL (ref 30.0–36.0)
MCV: 92.7 fL (ref 80.0–100.0)
Platelets: 224 10*3/uL (ref 150–400)
RBC: 4.26 MIL/uL (ref 3.87–5.11)
RDW: 13 % (ref 11.5–15.5)
WBC: 6.7 10*3/uL (ref 4.0–10.5)
nRBC: 0 % (ref 0.0–0.2)

## 2022-09-11 LAB — TYPE AND SCREEN
ABO/RH(D): O POS
Antibody Screen: NEGATIVE

## 2022-09-11 SURGERY — DILATION AND EVACUATION, UTERUS
Anesthesia: General

## 2022-09-11 MED ORDER — ACETAMINOPHEN 10 MG/ML IV SOLN
INTRAVENOUS | Status: DC | PRN
  Administered 2022-09-11: 1000 mg via INTRAVENOUS

## 2022-09-11 MED ORDER — FENTANYL CITRATE (PF) 250 MCG/5ML IJ SOLN
INTRAMUSCULAR | Status: DC | PRN
  Administered 2022-09-11 (×2): 50 ug via INTRAVENOUS

## 2022-09-11 MED ORDER — ONDANSETRON HCL 4 MG/2ML IJ SOLN
INTRAMUSCULAR | Status: AC
  Filled 2022-09-11: qty 2

## 2022-09-11 MED ORDER — SUGAMMADEX SODIUM 200 MG/2ML IV SOLN
INTRAVENOUS | Status: DC | PRN
  Administered 2022-09-11: 200 mg via INTRAVENOUS

## 2022-09-11 MED ORDER — PROPOFOL 10 MG/ML IV BOLUS
INTRAVENOUS | Status: AC
  Filled 2022-09-11: qty 20

## 2022-09-11 MED ORDER — KETOROLAC TROMETHAMINE 30 MG/ML IJ SOLN
INTRAMUSCULAR | Status: DC | PRN
  Administered 2022-09-11: 30 mg via INTRAVENOUS

## 2022-09-11 MED ORDER — 0.9 % SODIUM CHLORIDE (POUR BTL) OPTIME
TOPICAL | Status: DC | PRN
  Administered 2022-09-11: 1000 mL

## 2022-09-11 MED ORDER — ORAL CARE MOUTH RINSE: 15.0000 mL | Freq: Once | OROMUCOSAL | Status: DC

## 2022-09-11 MED ORDER — MIDAZOLAM HCL 2 MG/2ML IJ SOLN
INTRAMUSCULAR | Status: DC | PRN
  Administered 2022-09-11: 2 mg via INTRAVENOUS

## 2022-09-11 MED ORDER — POVIDONE-IODINE 10 % EX SWAB: 2.0000 | Freq: Once | CUTANEOUS | Status: DC

## 2022-09-11 MED ORDER — SCOPOLAMINE 1 MG/3DAYS TD PT72
MEDICATED_PATCH | TRANSDERMAL | Status: AC
  Filled 2022-09-11: qty 2

## 2022-09-11 MED ORDER — ACETAMINOPHEN 10 MG/ML IV SOLN
INTRAVENOUS | Status: AC
  Filled 2022-09-11: qty 100

## 2022-09-11 MED ORDER — ONDANSETRON HCL 4 MG/2ML IJ SOLN
INTRAMUSCULAR | Status: DC | PRN
  Administered 2022-09-11: 4 mg via INTRAVENOUS

## 2022-09-11 MED ORDER — MIDAZOLAM HCL 2 MG/2ML IJ SOLN
INTRAMUSCULAR | Status: AC
  Filled 2022-09-11: qty 2

## 2022-09-11 MED ORDER — SCOPOLAMINE 1 MG/3DAYS TD PT72
MEDICATED_PATCH | TRANSDERMAL | Status: DC | PRN
  Administered 2022-09-11: 1 via TRANSDERMAL

## 2022-09-11 MED ORDER — ROCURONIUM BROMIDE 10 MG/ML (PF) SYRINGE
PREFILLED_SYRINGE | INTRAVENOUS | Status: DC | PRN
  Administered 2022-09-11: 60 mg via INTRAVENOUS

## 2022-09-11 MED ORDER — FENTANYL CITRATE (PF) 250 MCG/5ML IJ SOLN
INTRAMUSCULAR | Status: AC
  Filled 2022-09-11: qty 5

## 2022-09-11 MED ORDER — DEXAMETHASONE SODIUM PHOSPHATE 10 MG/ML IJ SOLN
INTRAMUSCULAR | Status: AC
  Filled 2022-09-11: qty 1

## 2022-09-11 MED ORDER — PROPOFOL 10 MG/ML IV BOLUS
INTRAVENOUS | Status: DC | PRN
  Administered 2022-09-11: 150 mg via INTRAVENOUS

## 2022-09-11 MED ORDER — LIDOCAINE 2% (20 MG/ML) 5 ML SYRINGE
INTRAMUSCULAR | Status: DC | PRN
  Administered 2022-09-11: 60 mg via INTRAVENOUS

## 2022-09-11 MED ORDER — CHLORHEXIDINE GLUCONATE 0.12 % MT SOLN
15.0000 mL | Freq: Once | OROMUCOSAL | Status: DC
  Filled 2022-09-11: qty 15

## 2022-09-11 MED ORDER — KETOROLAC TROMETHAMINE 30 MG/ML IJ SOLN
INTRAMUSCULAR | Status: AC
  Filled 2022-09-11: qty 1

## 2022-09-11 MED ORDER — LACTATED RINGERS IV SOLN: INTRAVENOUS | Status: DC

## 2022-09-11 MED ORDER — DEXAMETHASONE SODIUM PHOSPHATE 10 MG/ML IJ SOLN
INTRAMUSCULAR | Status: DC | PRN
  Administered 2022-09-11: 10 mg via INTRAVENOUS

## 2022-09-11 SURGICAL SUPPLY — 22 items
CATH ROBINSON RED A/P 16FR (CATHETERS) ×2 IMPLANT
DRSG TELFA 3X8 NADH STRL (GAUZE/BANDAGES/DRESSINGS) ×2 IMPLANT
FILTER UTR ASPR ASSEMBLY (MISCELLANEOUS) ×2 IMPLANT
GLOVE BIO SURGEON STRL SZ 6.5 (GLOVE) ×2 IMPLANT
GLOVE BIOGEL PI IND STRL 7.0 (GLOVE) ×4 IMPLANT
GOWN STRL REUS W/ TWL LRG LVL3 (GOWN DISPOSABLE) ×4 IMPLANT
GOWN STRL REUS W/TWL LRG LVL3 (GOWN DISPOSABLE) ×2
HOSE CONNECTING 18IN BERKELEY (TUBING) ×2 IMPLANT
KIT BERKELEY 1ST TRI 3/8 NO TR (MISCELLANEOUS) ×2 IMPLANT
KIT BERKELEY 1ST TRIMESTER 3/8 (MISCELLANEOUS) ×2 IMPLANT
NS IRRIG 1000ML POUR BTL (IV SOLUTION) ×2 IMPLANT
PACK VAGINAL MINOR WOMEN LF (CUSTOM PROCEDURE TRAY) ×2 IMPLANT
PAD OB MATERNITY 4.3X12.25 (PERSONAL CARE ITEMS) ×2 IMPLANT
SET BERKELEY SUCTION TUBING (SUCTIONS) ×2 IMPLANT
SPIKE FLUID TRANSFER (MISCELLANEOUS) ×2 IMPLANT
TOWEL GREEN STERILE FF (TOWEL DISPOSABLE) ×4 IMPLANT
UNDERPAD 30X36 HEAVY ABSORB (UNDERPADS AND DIAPERS) ×2 IMPLANT
VACURETTE 10 RIGID CVD (CANNULA) IMPLANT
VACURETTE 7MM CVD STRL WRAP (CANNULA) IMPLANT
VACURETTE 8 RIGID CVD (CANNULA) IMPLANT
VACURETTE 8MM F TIP (MISCELLANEOUS) IMPLANT
VACURETTE 9 RIGID CVD (CANNULA) IMPLANT

## 2022-09-11 NOTE — Op Note (Addendum)
PREOPERATIVE DIAGNOSES: 1. Missed Abortion in 1st trimester  POSTOPERATIVE DIAGNOSES: Same  PROCEDURE PERFORMED: Dilation, suction, sharp curretage  SURGEON: Dr. Lucillie Garfinkel  ANESTHESIA: Paracervical block and IV sedation  ESTIMATED BLOOD LOSS: 50 cc.  COMPLICATIONS: None  TUBES: None.  DRAINS: None  PATHOLOGY: Endometrial curettings for chromosomal analysis  FINDINGS: On exam, under anesthesia, normal appearing vulva and vagina, 8 week sized uterus  Operative findings demonstrated plethora of POCs  Procedure: The patient was taken to the operating room where she was properly prepped and draped in sterile manner under general anesthesia. After bimanual examination, the cervix was exposed with a speculum and the anterior lip of the cervix grasped with a tenaculum. Paracervical block performed. The endocervical canal was then progressively dilated to 58mm. Suction catheter was introduced into the uterus and to the uterine fundus. The uterus was evacuated and good tissue return was noted. A sharp curettage was then performed until gritty texture noted. All instruments were removed from vagina. All was done under US guidance and no rPOC noted on last [place]. The sponge and lap counts were correct times 2 at this time. The patient's procedure was terminated. We then awakened her. She was sent to the Recovery Room in good condition.    Lucillie Garfinkel MD

## 2022-09-11 NOTE — Anesthesia Procedure Notes (Signed)
Procedure Name: Intubation Date/Time: 09/11/2022 2:23 PM  Performed by: Heide Scales, CRNAPre-anesthesia Checklist: Patient identified, Emergency Drugs available, Suction available and Patient being monitored Patient Re-evaluated:Patient Re-evaluated prior to induction Oxygen Delivery Method: Circle system utilized Preoxygenation: Pre-oxygenation with 100% oxygen Induction Type: IV induction Ventilation: Mask ventilation without difficulty Laryngoscope Size: Mac and 3 Grade View: Grade I Tube type: Oral Tube size: 7.0 mm Number of attempts: 1 Airway Equipment and Method: Stylet and Oral airway Placement Confirmation: ETT inserted through vocal cords under direct vision, positive ETCO2 and breath sounds checked- equal and bilateral Secured at: 23 cm Tube secured with: Tape Dental Injury: Teeth and Oropharynx as per pre-operative assessment  Comments: Performed by paramedic student under direct supervision by Dr. Doroteo Glassman.

## 2022-09-11 NOTE — Anesthesia Preprocedure Evaluation (Signed)
Anesthesia Evaluation  Patient identified by MRN, date of birth, ID band Patient awake    Reviewed: Allergy & Precautions, NPO status , Patient's Chart, lab work & pertinent test results, reviewed documented beta blocker date and time   Airway Mallampati: II  TM Distance: >3 FB Neck ROM: Full    Dental  (+) Teeth Intact, Dental Advisory Given   Pulmonary neg pulmonary ROS   Pulmonary exam normal breath sounds clear to auscultation       Cardiovascular hypertension, Pt. on home beta blockers Normal cardiovascular exam Rhythm:Regular Rate:Normal     Neuro/Psych  Headaches    GI/Hepatic negative GI ROS, Neg liver ROS,,,  Endo/Other  negative endocrine ROS    Renal/GU negative Renal ROS     Musculoskeletal negative musculoskeletal ROS (+)    Abdominal   Peds  Hematology negative hematology ROS (+)   Anesthesia Other Findings Day of surgery medications reviewed with the patient.  Reproductive/Obstetrics (+) Pregnancy Missed abortion                              Anesthesia Physical Anesthesia Plan  ASA: 2  Anesthesia Plan: General   Post-op Pain Management: Ofirmev IV (intra-op)* and Toradol IV (intra-op)*   Induction: Intravenous  PONV Risk Score and Plan: 3 and Scopolamine patch - Pre-op, Midazolam, Dexamethasone and Ondansetron  Airway Management Planned: Oral ETT  Additional Equipment:   Intra-op Plan:   Post-operative Plan: Extubation in OR  Informed Consent: I have reviewed the patients History and Physical, chart, labs and discussed the procedure including the risks, benefits and alternatives for the proposed anesthesia with the patient or authorized representative who has indicated his/her understanding and acceptance.     Dental advisory given  Plan Discussed with: CRNA  Anesthesia Plan Comments:        Anesthesia Quick Evaluation

## 2022-09-11 NOTE — Transfer of Care (Signed)
Immediate Anesthesia Transfer of Care Note  Patient: Kaitlyn Noble  Procedure(s) Performed: DILATATION AND EVACUATION OPERATIVE ULTRASOUND CHROMOSOME STUDIES  Patient Location: PACU  Anesthesia Type:General  Level of Consciousness: drowsy  Airway & Oxygen Therapy: Patient Spontanous Breathing  Post-op Assessment: Report given to RN and Post -op Vital signs reviewed and stable  Post vital signs: Reviewed and stable  Last Vitals:  Vitals Value Taken Time  BP 144/93 09/11/22 1456  Temp    Pulse 100 09/11/22 1457  Resp 19 09/11/22 1457  SpO2 98 % 09/11/22 1457  Vitals shown include unvalidated device data.  Last Pain:  Vitals:   09/11/22 1304  TempSrc:   PainSc: 0-No pain         Complications: No notable events documented.

## 2022-09-11 NOTE — Progress Notes (Signed)
No updates to above H&P. Patient arrived NPO and was consented in PACU. Risks again discussed, all questions answered, and consent signed. Proceed with above surgery.    Jonasia Coiner MD  

## 2022-09-11 NOTE — Anesthesia Postprocedure Evaluation (Signed)
Anesthesia Post Note  Patient: Health and safety inspector  Procedure(s) Performed: DILATATION AND EVACUATION OPERATIVE ULTRASOUND CHROMOSOME STUDIES     Patient location during evaluation: PACU Anesthesia Type: General Level of consciousness: awake and alert, oriented and patient cooperative Pain management: pain level controlled Vital Signs Assessment: post-procedure vital signs reviewed and stable Respiratory status: spontaneous breathing, nonlabored ventilation and respiratory function stable Cardiovascular status: blood pressure returned to baseline and stable Postop Assessment: no apparent nausea or vomiting Anesthetic complications: no   No notable events documented.  Last Vitals:  Vitals:   09/11/22 1241  BP: (!) 142/93  Pulse: 93  Resp: 20  Temp: 37.2 C  SpO2: 99%    Last Pain:  Vitals:   09/11/22 1304  TempSrc:   PainSc: 0-No pain                 Pervis Hocking

## 2022-09-12 ENCOUNTER — Encounter (HOSPITAL_COMMUNITY): Payer: Self-pay | Admitting: Obstetrics and Gynecology

## 2022-09-14 LAB — SURGICAL PATHOLOGY

## 2022-09-15 ENCOUNTER — Ambulatory Visit (HOSPITAL_COMMUNITY): Payer: Medicaid Other

## 2022-09-19 LAB — ANORA MISCARRIAGE TEST - FRESH

## 2023-02-17 ENCOUNTER — Other Ambulatory Visit (INDEPENDENT_AMBULATORY_CARE_PROVIDER_SITE_OTHER): Payer: Medicaid Other

## 2023-02-17 ENCOUNTER — Ambulatory Visit (INDEPENDENT_AMBULATORY_CARE_PROVIDER_SITE_OTHER): Payer: Medicaid Other | Admitting: Orthopaedic Surgery

## 2023-02-17 DIAGNOSIS — M25561 Pain in right knee: Secondary | ICD-10-CM | POA: Diagnosis not present

## 2023-02-17 DIAGNOSIS — M2241 Chondromalacia patellae, right knee: Secondary | ICD-10-CM | POA: Diagnosis not present

## 2023-02-17 DIAGNOSIS — G8929 Other chronic pain: Secondary | ICD-10-CM

## 2023-02-17 NOTE — Progress Notes (Signed)
The patient comes in today as a new patient although I have seen her mother before.  She is 32 years old and she has been dealing with off-and-on knee pain with her right knee for many years now.  She does wear a copper fit knee sleeve on occasion.  She denies any injuries.  She does have to get on her knees quite a bit with her toddler.  She has never had surgery or or injections in her knees.  She is otherwise active and not obese.  Examination of both knees shows actually slightly hyperextend.  Both knees have patellofemoral crepitation.  Both knees have full range of motion with no effusion and are ligamentously stable.  X-rays of the right knee show normal alignment.  There is slight narrowing of the patellofemoral joint.  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  The best thing that she can do is work on quad strengthening exercises and this could help her quite a bit.  She can certainly take glucosamine as needed but she does not have significant pain.  Most of this is a grinding at the patellas and there is really not a surgical intervention for that and she understands that she will likely have that for a long time.  If she does have any worsening symptoms she will let me know.  I did send her to some Internet sites for quad strengthening and patella chondromalacia exercises.  We can always set her up for formal physical therapy if needed.

## 2023-07-19 ENCOUNTER — Encounter: Payer: Self-pay | Admitting: *Deleted

## 2023-07-19 ENCOUNTER — Other Ambulatory Visit: Payer: Self-pay

## 2023-07-19 ENCOUNTER — Ambulatory Visit
Admission: EM | Admit: 2023-07-19 | Discharge: 2023-07-19 | Disposition: A | Discharge status: Home / Self Care | Payer: Medicaid Other | Attending: Physician Assistant | Admitting: Physician Assistant

## 2023-07-19 DIAGNOSIS — J028 Acute pharyngitis due to other specified organisms: Secondary | ICD-10-CM | POA: Diagnosis not present

## 2023-07-19 DIAGNOSIS — B9789 Other viral agents as the cause of diseases classified elsewhere: Secondary | ICD-10-CM | POA: Diagnosis not present

## 2023-07-19 DIAGNOSIS — Z8616 Personal history of COVID-19: Secondary | ICD-10-CM | POA: Insufficient documentation

## 2023-07-19 DIAGNOSIS — Z28310 Unvaccinated for covid-19: Secondary | ICD-10-CM

## 2023-07-19 DIAGNOSIS — Z7984 Long term (current) use of oral hypoglycemic drugs: Secondary | ICD-10-CM | POA: Diagnosis not present

## 2023-07-19 DIAGNOSIS — J029 Acute pharyngitis, unspecified: Secondary | ICD-10-CM | POA: Diagnosis not present

## 2023-07-19 LAB — POCT RAPID STREP A (OFFICE): Rapid Strep A Screen: NEGATIVE

## 2023-07-19 LAB — POCT INFLUENZA A/B
Influenza A, POC: NEGATIVE
Influenza B, POC: NEGATIVE

## 2023-07-19 MED ORDER — IBUPROFEN 600 MG PO TABS: 600.0000 mg | ORAL_TABLET | Freq: Three times a day (TID) | ORAL | 0 refills | Status: AC | PRN

## 2023-07-19 MED ORDER — LIDOCAINE VISCOUS HCL 2 % MT SOLN: 15.0000 mL | Freq: Four times a day (QID) | OROMUCOSAL | 0 refills | Status: AC | PRN

## 2023-07-19 NOTE — Discharge Instructions (Signed)
You are negative for strep and flu.  I will send your throat culture off and contact you if need to start antibiotics.  Gargle with warm salt water and use Tylenol for pain relief.  I have called in ibuprofen 600 mg.  Do not take NSAIDs with this medication including aspirin, ibuprofen/Advil, naproxen/Aleve.  I have also called in viscous lidocaine that you can gargle and spit out every 6-8 hours as needed.  Do not eat or drink immediately after using this medication as it can increase your risk for choking.  Make sure you rest and drink plenty of fluid.  If your symptoms are not improving within a few days or if anything worsens please return for reevaluation.

## 2023-07-19 NOTE — ED Provider Notes (Signed)
[name]    CSN: [id number] Arrival date & time: 07/19/23  0845      History   Chief Complaint Chief Complaint  Patient presents with   Sore Throat    HPI Kaitlyn Noble is a 33 y.o. female.   Patient presents today with a 1 day history of sore throat, body aches, headache.  Denies any cough, congestion, nausea, vomiting, diarrhea, shortness of breath, chest pain.  She has not taken any over-the-counter medication for symptom management.  Does report that her son was sick with similar symptoms before she developed the symptoms.  Denies any recent antibiotics or steroids.  She has had COVID several years ago.  She has not had COVID-19 vaccinations.  She denies any history of asthma, COPD, smoking.  She is eating and drinking normally.  Her sore throat pain is rated 5 on a 0-10 pain scale, described as aching, worse with swallowing, no alleviating factors identified.  She has no concern for pregnancy.    Past Medical History:  Diagnosis Date   Chronic hypertension    Costochondritis    history   COVID 2020   mild   Dyspnea    denies   Headache    Migraines   PCOS (polycystic ovarian syndrome)     Patient Active Problem List   Diagnosis Date Noted   Pregnant 05/11/2019   Candida vaginitis 02/26/2019   Round ligament pain 02/26/2019    Past Surgical History:  Procedure Laterality Date   DILATION AND EVACUATION N/A 12/22/2016   Procedure: DILATATION AND EVACUATION  with ultrasound guidance;  Surgeon: Ranae Pila, MD;  Location: [facility];  Service: Gynecology;  Laterality: N/A;   DILATION AND EVACUATION N/A 12/30/2021   Procedure: DILATATION AND EVACUATION;  Surgeon: Ranae Pila, MD;  Location: [facility] OR;  Service: Gynecology;  Laterality: N/A;   DILATION AND EVACUATION N/A 09/11/2022   Procedure: DILATATION AND EVACUATION;  Surgeon: Ranae Pila, MD;  Location: [facility] OR;  Service: Gynecology;  Laterality: N/A;   OPERATIVE ULTRASOUND  N/A 12/30/2021   Procedure: OPERATIVE ULTRASOUND;  Surgeon: Ranae Pila, MD;  Location: [facility] OR;  Service: Gynecology;  Laterality: N/A;   OPERATIVE ULTRASOUND N/A 09/11/2022   Procedure: OPERATIVE ULTRASOUND;  Surgeon: Ranae Pila, MD;  Location: [facility] OR;  Service: Gynecology;  Laterality: N/A;   SHOULDER SURGERY Left    UTERINE SEPTUM RESECTION     WISDOM TOOTH EXTRACTION      OB History     Gravida  3   Para  1   Term  1   Preterm      AB  1   Living  1      SAB  1   IAB      Ectopic      Multiple  0   Live Births  1            Home Medications    Prior to Admission medications   Medication Sig Start Date End Date Taking? Authorizing Provider  ibuprofen (ADVIL) 600 MG tablet Take 1 tablet (600 mg total) by mouth every 8 (eight) hours as needed. 07/19/23  Yes [place] Zou K, PA-C  labetalol (NORMODYNE) 100 MG tablet Take 100 mg by mouth 2 (two) times daily.   Yes [provider]  lidocaine (XYLOCAINE) 2 % solution Use as directed 15 mLs in the mouth or throat every 6 (six) hours as needed for mouth pain. 07/19/23  Yes Stanislaus Kaltenbach, Denny Peon  K, PA-C  Cholecalciferol (VITAMIN D) 125 MCG (5000 UT) CAPS     [provider]  metFORMIN (GLUCOPHAGE-XR) 500 MG 24 hr tablet Take 500 mg by mouth daily.    [provider]  tretinoin (RETIN-A) 0.025 % cream Apply topically at bedtime.    [provider]    Family History Family History  Problem Relation Age of Onset   Hypertension Mother    Diabetes Mother     Social History Social History   Tobacco Use   Smoking status: Never    Passive exposure: Never   Smokeless tobacco: Never  Vaping Use   Vaping status: Never Used  Substance Use Topics   Alcohol use: No   Drug use: No     Allergies   Peanut oil and Shellfish allergy   Review of Systems Review of Systems  Constitutional:  Positive for activity change. Negative for appetite change, fatigue and fever.   HENT:  Positive for sore throat. Negative for congestion, sinus pressure, sneezing, trouble swallowing and voice change.   Respiratory:  Negative for cough and shortness of breath.   Cardiovascular:  Negative for chest pain.  Gastrointestinal:  Negative for abdominal pain, diarrhea, nausea and vomiting.  Musculoskeletal:  Positive for arthralgias and myalgias.  Neurological:  Positive for headaches. Negative for dizziness and light-headedness.     Physical Exam Triage Vital Signs ED Triage Vitals  Encounter Vitals Group     BP 07/19/23 0914 (!) 141/95     Systolic BP Percentile --      Diastolic BP Percentile --      Pulse Rate 07/19/23 0914 100     Resp 07/19/23 0914 18     Temp 07/19/23 0914 99.8 F (37.7 C)     Temp Source 07/19/23 0914 Oral     SpO2 07/19/23 0914 96 %     Weight --      Height --      Head Circumference --      Peak Flow --      Pain Score 07/19/23 0911 5     Pain Loc --      Pain Education --      Exclude from Growth Chart --    No data found.  Updated Vital Signs BP (!) 141/95 (BP Location: Left Arm)   Pulse 100   Temp 99.8 F (37.7 C) (Oral)   Resp 18   LMP 06/17/2023   SpO2 96%   Breastfeeding No   Visual Acuity Right Eye Distance:   Left Eye Distance:   Bilateral Distance:    Right Eye Near:   Left Eye Near:    Bilateral Near:     Physical Exam Vitals reviewed.  Constitutional:      General: She is awake. She is not in acute distress.    Appearance: Normal appearance. She is well-developed. She is not ill-appearing.     Comments: Very pleasant female appears stated age in no acute distress sitting comfortably in exam room  HENT:     Head: Normocephalic and atraumatic.     Right Ear: Tympanic membrane, ear canal and external ear normal. Tympanic membrane is not erythematous or bulging.     Left Ear: Tympanic membrane, ear canal and external ear normal. Tympanic membrane is not erythematous or bulging.     Nose: Nose normal.      Right Sinus: No maxillary sinus tenderness or frontal sinus tenderness.     Left Sinus: No maxillary sinus  tenderness or frontal sinus tenderness.     Mouth/Throat:     Pharynx: Uvula midline. Posterior oropharyngeal erythema present. No oropharyngeal exudate.  Cardiovascular:     Rate and Rhythm: Normal rate and regular rhythm.     Heart sounds: Normal heart sounds, S1 normal and S2 normal. No murmur heard. Pulmonary:     Effort: Pulmonary effort is normal.     Breath sounds: Normal breath sounds. No wheezing, rhonchi or rales.     Comments: Clear to auscultation bilaterally Psychiatric:        Behavior: Behavior is cooperative.      UC Treatments / Results  Labs (all labs ordered are listed, but only abnormal results are displayed) Labs Reviewed  POCT INFLUENZA A/B - Normal  POCT RAPID STREP A (OFFICE) - Normal  CULTURE, GROUP A STREP [facility])    EKG   Radiology No results found.  Procedures Procedures (including critical care time)  Medications Ordered in UC Medications - No data to display  Initial Impression / Assessment and Plan / UC Course  I have reviewed the triage vital signs and the nursing notes.  Pertinent labs & imaging results that were available during my care of the patient were reviewed by me and considered in my medical decision making (see chart for details).     Patient is well-appearing, afebrile, nontoxic, nontachycardic.  She does negative for flu and strep.  Throat culture is pending but will defer antibiotics until culture results are available.  Suspect viral pharyngitis as etiology of symptoms.  She was given ibuprofen 600 mg to be taken for pain relief with instruction not to take additional NSAIDs with this medication and risk of GI bleeding.  She can use Tylenol as needed for additional pain relief.  She was also given viscous lidocaine for symptom relief with instruction not to eat or drink immediately after using this medication as it can  increase the risk of choking.  If your symptoms are not improving within a few days or if anything worsens and she has swelling of her throat, shortness of breath, muffled voice she needs to be seen emergently.  Strict return precautions given.  Work excuse note provided.  Final Clinical Impressions(s) / UC Diagnoses   Final diagnoses:  Viral pharyngitis  Sore throat     Discharge Instructions      You are negative for strep and flu.  I will send your throat culture off and contact you if need to start antibiotics.  Gargle with warm salt water and use Tylenol for pain relief.  I have called in ibuprofen 600 mg.  Do not take NSAIDs with this medication including aspirin, ibuprofen/Advil, naproxen/Aleve.  I have also called in viscous lidocaine that you can gargle and spit out every 6-8 hours as needed.  Do not eat or drink immediately after using this medication as it can increase your risk for choking.  Make sure you rest and drink plenty of fluid.  If your symptoms are not improving within a few days or if anything worsens please return for reevaluation.     ED Prescriptions     Medication Sig Dispense Auth. Provider   lidocaine (XYLOCAINE) 2 % solution Use as directed 15 mLs in the mouth or throat every 6 (six) hours as needed for mouth pain. 100 mL Demetrias Goodbar K, PA-C   ibuprofen (ADVIL) 600 MG tablet Take 1 tablet (600 mg total) by mouth every 8 (eight) hours as needed. 30 tablet Tashe Purdon, Denny Peon  K, PA-C      PDMP not reviewed this encounter.   Jeani Hawking, PA-C 07/19/23 1043

## 2023-07-19 NOTE — ED Triage Notes (Signed)
Sore throat since last night. Denies fever. Endorses body aches

## 2023-07-21 LAB — CULTURE, GROUP A STREP (THRC)

## 2023-08-07 ENCOUNTER — Other Ambulatory Visit: Payer: Self-pay

## 2023-08-07 ENCOUNTER — Ambulatory Visit
Admission: EM | Admit: 2023-08-07 | Discharge: 2023-08-07 | Disposition: A | Discharge status: Home / Self Care | Payer: Medicaid Other

## 2023-08-07 DIAGNOSIS — Z9889 Other specified postprocedural states: Secondary | ICD-10-CM | POA: Insufficient documentation

## 2023-08-07 DIAGNOSIS — R053 Chronic cough: Secondary | ICD-10-CM

## 2023-08-07 DIAGNOSIS — Q519 Congenital malformation of uterus and cervix, unspecified: Secondary | ICD-10-CM | POA: Insufficient documentation

## 2023-08-07 DIAGNOSIS — Z20828 Contact with and (suspected) exposure to other viral communicable diseases: Secondary | ICD-10-CM | POA: Diagnosis not present

## 2023-08-07 DIAGNOSIS — N859 Noninflammatory disorder of uterus, unspecified: Secondary | ICD-10-CM | POA: Insufficient documentation

## 2023-08-07 LAB — POC COVID19/FLU A&B COMBO
Covid Antigen, POC: NEGATIVE
Influenza A Antigen, POC: NEGATIVE
Influenza B Antigen, POC: NEGATIVE

## 2023-08-07 MED ORDER — AMOXICILLIN-POT CLAVULANATE 875-125 MG PO TABS: 1.0000 | ORAL_TABLET | Freq: Two times a day (BID) | ORAL | 0 refills | Status: AC

## 2023-08-07 MED ORDER — OSELTAMIVIR PHOSPHATE 75 MG PO CAPS: 75.0000 mg | ORAL_CAPSULE | Freq: Two times a day (BID) | ORAL | 0 refills | Status: AC

## 2023-08-07 NOTE — ED Provider Notes (Signed)
 [name]    CSN: [id number] Arrival date & time: 08/07/23  0806      History   Chief Complaint Chief Complaint  Patient presents with   Cough   Emesis    HPI Kaitlyn Noble is a 33 y.o. female.   Patient here today for evaluation of cough she has had for the last 2 weeks.  She reports over the last day she started develop body aches and fever.  Her son is here with similar symptoms.  She has taken over-the-counter medication with mild relief.  The history is provided by the patient.    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   Disorder of uterus 08/07/2023   Vaginal delivery 08/07/2023   Abnormal cervical Papanicolaou smear 06/14/2019   Essential (primary) hypertension 06/14/2019   Polycystic ovary syndrome 06/14/2019   Routine health maintenance 04/01/2012    Past Surgical History:  Procedure Laterality Date   DILATION AND EVACUATION N/A 12/22/2016   Procedure: DILATATION AND EVACUATION  with ultrasound guidance;  Surgeon: Ranae Pila, MD;  Location: [facility];  Service: Gynecology;  Laterality: N/A;   DILATION AND EVACUATION N/A 12/30/2021   Procedure: DILATATION AND EVACUATION;  Surgeon: Ranae Pila, MD;  Location: [facility] OR;  Service: Gynecology;  Laterality: N/A;   DILATION AND EVACUATION N/A 09/11/2022   Procedure: DILATATION AND EVACUATION;  Surgeon: Ranae Pila, MD;  Location: [facility] OR;  Service: Gynecology;  Laterality: N/A;   OPERATIVE ULTRASOUND N/A 12/30/2021   Procedure: OPERATIVE ULTRASOUND;  Surgeon: Ranae Pila, MD;  Location: [facility] OR;  Service: Gynecology;  Laterality: N/A;   OPERATIVE ULTRASOUND N/A 09/11/2022   Procedure: OPERATIVE ULTRASOUND;  Surgeon: Ranae Pila, MD;  Location: [facility] OR;  Service: Gynecology;  Laterality: N/A;   SHOULDER SURGERY Left    UTERINE SEPTUM RESECTION     WISDOM TOOTH EXTRACTION      OB History     Gravida  3   Para  1   Term  1   Preterm      AB  1   Living  1      SAB  1   IAB      Ectopic      Multiple  0   Live Births  1            Home Medications    Prior to Admission medications   Medication Sig Start Date End Date Taking? Authorizing Provider  amoxicillin-clavulanate (AUGMENTIN) 875-125 MG tablet Take 1 tablet by mouth every 12 (twelve) hours. 08/07/23  Yes Tomi Bamberger, PA-C  fluconazole (DIFLUCAN) 150 MG tablet Take 150 mg by mouth once. 01/29/23  Yes [provider]  oseltamivir (TAMIFLU) 75 MG capsule Take 1 capsule (75 mg total) by mouth every 12 (twelve) hours. 08/07/23  Yes Tomi Bamberger, PA-C  Vitamin D, Ergocalciferol, (DRISDOL) 1.25 MG (50000 UNIT) CAPS capsule Take 50,000 Units by mouth every 7 (seven) days. 11/24/22  Yes [provider]  Cholecalciferol (VITAMIN D) 125 MCG (5000 UT) CAPS     [provider]  ibuprofen (ADVIL) 600 MG tablet Take 1 tablet (600 mg total) by mouth every 8 (eight) hours as needed. 07/19/23   Raspet, Noberto Retort, PA-C  labetalol (NORMODYNE) 100 MG tablet Take 100 mg by mouth 2 (two) times daily.    [provider]  labetalol (NORMODYNE) 100 MG tablet Take 100 mg by mouth 2 (two) times daily.    [provider]  levocetirizine (XYZAL) 2.5 MG/5ML solution Take 5 mg by mouth every evening.    [provider]  levocetirizine (XYZAL) 5 MG tablet Take 5 mg by mouth every evening.    [provider]  lidocaine (XYLOCAINE) 2 % solution Use as directed 15 mLs in the mouth or throat every 6 (six) hours as needed for mouth  pain. 07/19/23   Raspet, Noberto Retort, PA-C  metFORMIN (GLUCOPHAGE-XR) 500 MG 24 hr tablet Take 500 mg by mouth daily.    [provider]  metFORMIN (GLUCOPHAGE-XR) 500 MG 24 hr tablet Take 1 tablet by mouth daily.    [provider]  Metformin HCl 500 MG/5ML SOLN     [provider]  tretinoin (RETIN-A) 0.025 % cream Apply topically at bedtime.    [provider]    Family History Family History  Problem Relation Age of Onset   Hypertension Mother    Diabetes Mother     Social History Social History   Tobacco Use   Smoking status: Never    Passive exposure: Never   Smokeless tobacco: Never  Vaping Use   Vaping status: Never Used  Substance Use Topics   Alcohol use: No   Drug use: No     Allergies   Peanut oil, Shellfish allergy, Peanut-containing drug products, and Shellfish-derived products   Review of Systems Review of Systems  Constitutional:  Positive for chills and fever (subjective).  HENT:  Positive for congestion. Negative for ear pain and sore throat.   Eyes:  Negative for discharge and redness.  Respiratory:  Positive for cough. Negative for shortness of breath and wheezing.   Gastrointestinal:  Positive for nausea and vomiting. Negative for abdominal pain and diarrhea.  Musculoskeletal:  Positive for myalgias.     Physical Exam Triage Vital Signs ED Triage Vitals  Encounter Vitals Group     BP      Systolic BP Percentile      Diastolic BP Percentile      Pulse      Resp      Temp      Temp src      SpO2      Weight      Height      Head Circumference      Peak Flow      Pain Score      Pain Loc      Pain Education      Exclude from Growth Chart    No data found.  Updated Vital Signs BP 125/74 (BP Location: Right Arm)   Pulse (!) 110   Temp 98.6 F (37 C) (Oral)   Resp 18   LMP 06/17/2023   SpO2 96%   Visual Acuity Right Eye Distance:   Left Eye Distance:   Bilateral Distance:    Right Eye Near:    Left Eye Near:    Bilateral Near:     Physical Exam Vitals and nursing note reviewed.  Constitutional:  General: She is not in acute distress.    Appearance: Normal appearance. She is not ill-appearing.  HENT:     Head: Normocephalic and atraumatic.     Nose: Congestion present.     Mouth/Throat:     Mouth: Mucous membranes are moist.     Pharynx: No oropharyngeal exudate or posterior oropharyngeal erythema.  Eyes:     Conjunctiva/sclera: Conjunctivae normal.  Cardiovascular:     Rate and Rhythm: Normal rate and regular rhythm.     Heart sounds: Normal heart sounds. No murmur heard. Pulmonary:     Effort: Pulmonary effort is normal. No respiratory distress.     Breath sounds: Normal breath sounds. No wheezing, rhonchi or rales.  Skin:    General: Skin is warm and dry.  Neurological:     Mental Status: She is alert.  Psychiatric:        Mood and Affect: Mood normal.        Thought Content: Thought content normal.      UC Treatments / Results  Labs (all labs ordered are listed, but only abnormal results are displayed) Labs Reviewed  POC COVID19/FLU A&B COMBO - Normal    EKG   Radiology No results found.  Procedures Procedures (including critical care time)  Medications Ordered in UC Medications - No data to display  Initial Impression / Assessment and Plan / UC Course  I have reviewed the triage vital signs and the nursing notes.  Pertinent labs & imaging results that were available during my care of the patient were reviewed by me and considered in my medical decision making (see chart for details).    Flu test negative in office however son tested positive today.  Suspect early screening for mom and will treat with Tamiflu.  Augmentin also prescribed given persistent cough, concern for developing pneumonia with likely new superimposed influenza.  Encouraged symptomatic treatment, increase fluids and rest with follow-up if no gradual improvement or with  any worsening symptoms.  Final Clinical Impressions(s) / UC Diagnoses   Final diagnoses:  Persistent cough  Exposure to the flu   Discharge Instructions   None    ED Prescriptions     Medication Sig Dispense Auth. Provider   oseltamivir (TAMIFLU) 75 MG capsule Take 1 capsule (75 mg total) by mouth every 12 (twelve) hours. 10 capsule Tomi Bamberger, PA-C   amoxicillin-clavulanate (AUGMENTIN) 875-125 MG tablet Take 1 tablet by mouth every 12 (twelve) hours. 14 tablet Tomi Bamberger, PA-C      PDMP not reviewed this encounter.   Tomi Bamberger, PA-C 08/07/23 [phone]

## 2023-08-07 NOTE — ED Triage Notes (Signed)
 Pt here for coughing on and off x 2 weeks with worsening 2 days ago with pain with cough; pt sts body aches, fever and vomiting starting yesterday

## 2024-01-05 ENCOUNTER — Encounter: Payer: Self-pay | Admitting: Emergency Medicine

## 2024-01-05 ENCOUNTER — Ambulatory Visit
Admission: EM | Admit: 2024-01-05 | Discharge: 2024-01-05 | Disposition: A | Discharge status: Home / Self Care | Attending: Family Medicine | Admitting: Family Medicine

## 2024-01-05 DIAGNOSIS — J029 Acute pharyngitis, unspecified: Secondary | ICD-10-CM | POA: Diagnosis present

## 2024-01-05 LAB — POCT RAPID STREP A (OFFICE): Rapid Strep A Screen: NEGATIVE

## 2024-01-05 NOTE — ED Provider Notes (Signed)
 [facility]   [id number] 01/05/24 Arrival Time: 1034  ASSESSMENT & PLAN:  1. Sore throat    No signs of peritonsillar abscess.  Results for orders placed or performed during the hospital encounter of 01/05/24  POCT rapid strep A   Collection Time: 01/05/24 11:21 AM  Result Value Ref Range   Rapid Strep A Screen Negative Negative   Labs Reviewed  POCT RAPID STREP A (OFFICE) - Normal  CULTURE, GROUP A STREP [facility])   Throat culture pending. Likely viral; discussed.    Discharge Instructions      You may use over the counter ibuprofen  or acetaminophen  as needed.  For a sore throat, over the counter products such as [employer] Peroxyl Mouth Sore Rinse or Chloraseptic Sore Throat Spray may provide some temporary relief. Your rapid strep test was negative today. We have sent your throat swab for culture and will let you know of any positive results.    Reviewed expectations re: course of current medical issues. Questions answered. Outlined signs and symptoms indicating need for more acute intervention. Patient verbalized understanding. After Visit Summary given.   SUBJECTIVE:  Kaitlyn Noble is a 33 y.o. female who reports a sore throat; abrupt; x 1 day. Denies fever. Otherwise well. No tx PTA.    OBJECTIVE:  Vitals:   01/05/24 1104 01/05/24 1105  BP:  (!) 147/91  Pulse:  (!) 103  Resp:  18  Temp:  99 F (37.2 C)  TempSrc:  Oral  SpO2:  98%  Weight: 83.5 kg      General appearance: alert; no distress HEENT: throat with mild erythema and cobblestoning; uvula is midline Neck: supple with FROM; no lymphadenopathy Lungs: speaks full sentences without difficulty; unlabored Abd: soft; non-tender Skin: reveals no rash; warm and dry Psychological: alert and cooperative; normal mood and affect  Allergies  Allergen Reactions   Peanut Oil Hives    Patient states can tolerate items cooked in the oil   Shellfish Allergy     unknown Other reaction(s):  Other unknown   Peanut-Containing Drug Products Hives   Shellfish-Derived Products     Other Reaction(s): Not available, Other (See Comments)  shellfish derived    Past Medical History:  Diagnosis Date   Candida vaginitis 02/26/2019   Chronic hypertension    Costochondritis    history   COVID 2020   mild   Dyspnea    denies   Headache    Migraines   PCOS (polycystic ovarian syndrome)    Pregnant 05/11/2019   Round ligament pain 02/26/2019   Social History   Socioeconomic History   Marital status: Single    Spouse name: Not on file   Number of children: Not on file   Years of education: Not on file   Highest education level: Not on file  Occupational History   Not on file  Tobacco Use   Smoking status: Never    Passive exposure: Never   Smokeless tobacco: Never  Vaping Use   Vaping status: Never Used  Substance and Sexual Activity   Alcohol use: No   Drug use: No   Sexual activity: Yes    Birth control/protection: None  Other Topics Concern   Not on file  Social History Narrative   Not on file   Social Drivers of Health   Financial Resource Strain: Not on file  Food Insecurity: Not on file  Transportation Needs: Not on file  Physical Activity: Not on file  Stress: Not on file  Social Connections: Not on file  Intimate Partner Violence: Not on file   Family History  Problem Relation Age of Onset   Hypertension Mother    Diabetes Mother            Rolinda Rogue, MD 01/05/24 [phone]

## 2024-01-05 NOTE — ED Triage Notes (Signed)
 Pt presents c/o sore throat x 1 day. Pt denies any additional sxs.

## 2024-01-05 NOTE — Discharge Instructions (Signed)
 You may use over the counter ibuprofen or acetaminophen as needed.  For a sore throat, over the counter products such as [employer] Peroxyl Mouth Sore Rinse or Chloraseptic Sore Throat Spray may provide some temporary relief. Your rapid strep test was negative today. We have sent your throat swab for culture and will let you know of any positive results.

## 2024-01-08 LAB — CULTURE, GROUP A STREP (THRC)

## 2024-01-10 ENCOUNTER — Ambulatory Visit (HOSPITAL_COMMUNITY): Payer: Self-pay
